# Patient Record
Sex: Female | Born: 1995 | Hispanic: Yes | Marital: Married | Smoking: Never smoker
Health system: Southern US, Community
[De-identification: ages and names within clinical notes are randomized; demographics above are authoritative.]

## PROBLEM LIST (undated history)

## (undated) DIAGNOSIS — Z789 Other specified health status: Secondary | ICD-10-CM

## (undated) DIAGNOSIS — O3680X0 Pregnancy with inconclusive fetal viability, not applicable or unspecified: Secondary | ICD-10-CM

## (undated) HISTORY — PX: ANKLE SURGERY: SHX546

## (undated) HISTORY — DX: Pregnancy with inconclusive fetal viability, not applicable or unspecified: O36.80X0

## (undated) HISTORY — PX: BUNIONECTOMY: SHX129

## (undated) HISTORY — DX: Other specified health status: Z78.9

---
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
## 2017-03-05 ENCOUNTER — Ambulatory Visit: Payer: Medicaid Other | Admitting: Podiatry

## 2017-03-15 ENCOUNTER — Ambulatory Visit: Payer: Self-pay | Admitting: Podiatry

## 2017-03-15 ENCOUNTER — Ambulatory Visit (INDEPENDENT_AMBULATORY_CARE_PROVIDER_SITE_OTHER): Payer: Medicaid Other | Admitting: Podiatry

## 2017-03-15 DIAGNOSIS — B351 Tinea unguium: Secondary | ICD-10-CM

## 2017-03-18 NOTE — Progress Notes (Signed)
   Subjective: Patient presents today for follow-up treatment and evaluation of fungal nails. She states her symptoms are unchanged since previous visit. She reports no change since she has been using Lamisil. Patient presents today for further treatment and evaluation.   No past medical history on file.   Objective: Physical Exam General: The patient is alert and oriented x3 in no acute distress.  Dermatology: Hyperkeratotic, discolored, thickened, onychodystrophy of nails noted.  Skin is warm, dry and supple bilateral lower extremities. Negative for open lesions or macerations.  Vascular: Palpable pedal pulses bilaterally. No edema or erythema noted. Capillary refill within normal limits.  Neurological: Epicritic and protective threshold grossly intact bilaterally.   Musculoskeletal Exam: Range of motion within normal limits to all pedal and ankle joints bilateral. Muscle strength 5/5 in all groups bilateral.   Assessment: #1 onychomycosis right foot  Plan of Care:  #1 Patient was evaluated. #2 Appt with Shanda Bumps, RN for laser treatment. #3 Patient recently completed 90 days of oral Lamisil. #4 Return to clinic in 6 months.  Felecia Shelling, DPM [facility]  Dr. Felecia Shelling, DPM    [address]                                        [place], [place] [address]                Office [phone]  Fax [phone]

## 2017-04-18 ENCOUNTER — Ambulatory Visit: Payer: Medicaid Other

## 2017-11-27 ENCOUNTER — Emergency Department: Payer: Medicaid Other

## 2017-11-27 ENCOUNTER — Other Ambulatory Visit: Payer: Self-pay

## 2017-11-27 ENCOUNTER — Emergency Department
Admission: EM | Admit: 2017-11-27 | Discharge: 2017-11-27 | Disposition: A | Discharge status: Home / Self Care | Payer: Medicaid Other | Attending: Emergency Medicine | Admitting: Emergency Medicine

## 2017-11-27 DIAGNOSIS — Z3490 Encounter for supervision of normal pregnancy, unspecified, unspecified trimester: Secondary | ICD-10-CM

## 2017-11-27 DIAGNOSIS — Z79899 Other long term (current) drug therapy: Secondary | ICD-10-CM | POA: Diagnosis not present

## 2017-11-27 DIAGNOSIS — R1030 Lower abdominal pain, unspecified: Secondary | ICD-10-CM | POA: Diagnosis present

## 2017-11-27 LAB — CHLAMYDIA/NGC RT PCR (ARMC ONLY)
CHLAMYDIA TR: NOT DETECTED
N GONORRHOEAE: NOT DETECTED

## 2017-11-27 LAB — CBC
HEMATOCRIT: 39.8 % (ref 35.0–47.0)
Hemoglobin: 14 g/dL (ref 12.0–16.0)
MCH: 29.2 pg (ref 26.0–34.0)
MCHC: 35.3 g/dL (ref 32.0–36.0)
MCV: 82.8 fL (ref 80.0–100.0)
PLATELETS: 270 10*3/uL (ref 150–440)
RBC: 4.81 MIL/uL (ref 3.80–5.20)
RDW: 13.3 % (ref 11.5–14.5)
WBC: 14.2 10*3/uL — AB (ref 3.6–11.0)

## 2017-11-27 LAB — URINALYSIS, COMPLETE (UACMP) WITH MICROSCOPIC
Bilirubin Urine: NEGATIVE
Glucose, UA: NEGATIVE mg/dL
Hgb urine dipstick: NEGATIVE
Ketones, ur: NEGATIVE mg/dL
Nitrite: NEGATIVE
PH: 7 (ref 5.0–8.0)
Protein, ur: NEGATIVE mg/dL
SPECIFIC GRAVITY, URINE: 1.016 (ref 1.005–1.030)

## 2017-11-27 LAB — COMPREHENSIVE METABOLIC PANEL
ALBUMIN: 4.1 g/dL (ref 3.5–5.0)
ALT: 48 U/L (ref 14–54)
AST: 49 U/L — AB (ref 15–41)
Alkaline Phosphatase: 62 U/L (ref 38–126)
Anion gap: 8 (ref 5–15)
BILIRUBIN TOTAL: 0.8 mg/dL (ref 0.3–1.2)
BUN: 10 mg/dL (ref 6–20)
CHLORIDE: 106 mmol/L (ref 101–111)
CO2: 21 mmol/L — ABNORMAL LOW (ref 22–32)
Calcium: 8.9 mg/dL (ref 8.9–10.3)
Creatinine, Ser: 0.55 mg/dL (ref 0.44–1.00)
GFR calc Af Amer: >60 mL/min (ref >60)
GLUCOSE: 127 mg/dL — AB (ref 65–99)
POTASSIUM: 3.4 mmol/L — AB (ref 3.5–5.1)
Sodium: 135 mmol/L (ref 135–145)
TOTAL PROTEIN: 6.7 g/dL (ref 6.5–8.1)

## 2017-11-27 LAB — WET PREP, GENITAL
CLUE CELLS WET PREP: NONE SEEN
Sperm: NONE SEEN
Trich, Wet Prep: NONE SEEN
Yeast Wet Prep HPF POC: NONE SEEN

## 2017-11-27 LAB — POCT PREGNANCY, URINE: Preg Test, Ur: POSITIVE — AB

## 2017-11-27 LAB — HCG, QUANTITATIVE, PREGNANCY: HCG, BETA CHAIN, QUANT, S: 3159 m[IU]/mL — AB (ref <5)

## 2017-11-27 LAB — LIPASE, BLOOD: Lipase: 45 U/L (ref 11–51)

## 2017-11-27 NOTE — ED Triage Notes (Addendum)
Pt c/o lower abd pain that began last night. Denies N&V&D. Denies pelvic pain. States x 1 week intermittent back and abd pain. Alert, oriented, ambulatory. Still has appendix and gallbladder. Denies urinary symptoms. Hasn't noticed correlation with pain with eating. No distress noted in triage.   States had positive pregnancy test at home. Last period was end of April, has not seen OB yet. Denies vaginal bleeding. States vaginal discharge that is white.

## 2017-11-27 NOTE — Discharge Instructions (Addendum)
You have been seen in the emergency department.  Your results show that you are likely pregnant however no pregnancy can be identified on your ultrasound.  Please follow-up with the health department on Monday for recheck of your pregnancy hormone level (beta hCG) today's level is 3100.  Return to the emergency department for any acute abdominal pain, or any other symptom personally concerning to yourself.

## 2017-11-27 NOTE — ED Provider Notes (Signed)
[facility] Emergency Department Provider Note  Time seen: 6:02 PM  I have reviewed the triage vital signs and the nursing notes.   HISTORY  Chief Complaint Abdominal Pain    HPI Shelby Alvarez is a 22 y.o. female with no significant past medical history presents to the emergency department with lower abdominal discomfort and a positive pregnancy test.  According to the patient for the past 1.5 weeks she has had various abdominal pains including upper abdomen, now lower abdomen.  States they are very mild dull type pains.  States she is also having vaginal discharge.  Took a pregnancy test at home several days ago that was positive.  Patient is not entirely sure when her last period was.  No prior pregnancies miscarriages or abortions.  Currently patient states she feels well with very slight lower abdominal discomfort.  Denies dysuria or hematuria.  Denies any vaginal bleeding.   History reviewed. No pertinent past medical history.  There are no active problems to display for this patient.   Past Surgical History:  Procedure Laterality Date  . ANKLE SURGERY    . BUNIONECTOMY      Prior to Admission medications   Medication Sig Start Date End Date Taking? Authorizing Provider  clindamycin (CLEOCIN T) 1 % lotion Apply topically. 11/06/16   [provider]  ELIDEL 1 % cream apply TO FACE twice a day 11/13/16   [provider]  minocycline (MINOCIN,DYNACIN) 100 MG capsule Take 100 mg by mouth. 11/13/16   [provider]  terbinafine (LAMISIL) 250 MG tablet Take 1 tablet (250 mg total) by mouth daily. 11/27/16   Felecia Shelling, DPM  triamcinolone ointment (KENALOG) 0.1 % apply to affected area twice a day 11/06/16   [provider]    No Known Allergies  History reviewed. No pertinent family history.  Social History Social History   Tobacco Use  . Smoking status: Never Smoker  . Smokeless tobacco: Never Used   Substance Use Topics  . Alcohol use: No    Alcohol/week: 0.0 oz  . Drug use: Not on file    Review of Systems Constitutional: Negative for fever. Eyes: Negative for visual complaints ENT: Negative for recent illness/congestion Cardiovascular: Negative for chest pain. Respiratory: Negative for shortness of breath. Gastrointestinal: Intermittent abdominal pains, currently very mild lower abdominal discomfort.  Negative for nausea vomiting or diarrhea. Genitourinary: Negative for hematuria or dysuria. Musculoskeletal: Negative for musculoskeletal complaints Skin: Negative for skin complaints  Neurological: Negative for headache All other ROS negative  ____________________________________________   PHYSICAL EXAM:  VITAL SIGNS: ED Triage Vitals  Enc Vitals Group     BP 11/27/17 1639 (!) 131/47     Pulse Rate 11/27/17 1639 (!) 121     Resp 11/27/17 1639 18     Temp 11/27/17 1639 98 F (36.7 C)     Temp Source 11/27/17 1639 Oral     SpO2 11/27/17 1639 99 %     Weight 11/27/17 1640 150 lb (68 kg)     Height 11/27/17 1640 4\' 11"  (1.499 m)     Head Circumference --      Peak Flow --      Pain Score 11/27/17 1640 4     Pain Loc --      Pain Edu? --      Excl. in GC? --    Constitutional: Alert and oriented. Well appearing and in no distress. Eyes: Normal exam ENT   Head: Normocephalic  and atraumatic.   Mouth/Throat: Mucous membranes are moist. Cardiovascular: Normal rate, regular rhythm. No murmur Respiratory: Normal respiratory effort without tachypnea nor retractions. Breath sounds are clear  Gastrointestinal: Soft, very minimal suprapubic tenderness to palpation.  No rebound or guarding.  No distention. Musculoskeletal: Nontender with normal range of motion in all extremities.  Neurologic:  Normal speech and language. No gross focal neurologic deficits  Skin:  Skin is warm, dry and intact.  Psychiatric: Mood and affect are normal.    ____________________________________________  RADIOLOGY  No pregnancy identified on ultrasound  ____________________________________________   INITIAL IMPRESSION / ASSESSMENT AND PLAN / ED COURSE  Pertinent labs & imaging results that were available during my care of the patient were reviewed by me and considered in my medical decision making (see chart for details).  Patient presents to the emergency department for mild lower abdominal discomfort and a positive pregnancy test.  Also states white vaginal discharge.  Differential would include pregnancy, ectopic pregnancy, pelvic infection.  We will check labs, perform a pelvic examination, and obtain an ultrasound.  Patient's lab work shows a mild leukocytosis of 14,000 otherwise largely normal.  Pregnancy test is positive.  Beta quantitative test has been sent.  Pelvic exam shows a mild amount of white discharge.  Nontender.  No pregnancy identified on ultrasound.  Patient's beta-hCG is 3100.  I discussed with the patient follow-up on Monday for recheck of her beta hCG level, preferably at the health department.  Patient agreeable to plan of care.  Also discussed return precautions for any acute abdominal pain. ____________________________________________   FINAL CLINICAL IMPRESSION(S) / ED DIAGNOSES  Pregnancy of unknown location    Minna AntisPaduchowski, Camila Maita, MD 11/27/17 2017

## 2017-11-29 LAB — URINE CULTURE

## 2017-12-02 ENCOUNTER — Ambulatory Visit (INDEPENDENT_AMBULATORY_CARE_PROVIDER_SITE_OTHER): Payer: Medicaid Other | Admitting: Obstetrics and Gynecology

## 2017-12-02 ENCOUNTER — Encounter: Payer: Self-pay | Admitting: Obstetrics and Gynecology

## 2017-12-02 VITALS — BP 102/62 | HR 90 | Ht 59.0 in | Wt 159.0 lb

## 2017-12-02 DIAGNOSIS — O283 Abnormal ultrasonic finding on antenatal screening of mother: Secondary | ICD-10-CM

## 2017-12-02 DIAGNOSIS — O3680X0 Pregnancy with inconclusive fetal viability, not applicable or unspecified: Secondary | ICD-10-CM

## 2017-12-02 DIAGNOSIS — N898 Other specified noninflammatory disorders of vagina: Secondary | ICD-10-CM | POA: Diagnosis not present

## 2017-12-02 HISTORY — DX: Pregnancy with inconclusive fetal viability, not applicable or unspecified: O36.80X0

## 2017-12-02 NOTE — Progress Notes (Signed)
Patient ID: Shelby Alvarez, female   DOB: June 02, 1996, 22 y.o.   MRN: [id number]  Reason for Consult: Follow-up (ER f/u pregnancy unknown location)   Referred by No ref. provider found  Subjective:     HPI:  Shelby Alvarez is a 22 y.o. female she presents today for ER follow-up for a pregnancy of unknown location.  She was seen in the ER on 11/27/2017.  She reports that she has been having a small amount of back and neck pain both in the midline.  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  Patient states that the first day of her last menstrual period was on 420 of 2019 she says that her periods are usually irregular.  She said she had regular periods in February March and April but before that had had no period for 4 to 6 months.  She cannot pin time and exact date when she might of conceived.  She denies any history of sexually transmitted infections.  This is her first pregnancy.  She is not a smoker.  She denies any severe abdominal pain.  She denies lightheadedness or dizziness.  She denies any nausea or vomiting.   Records, labs and images from ER visit reviewed.   No past medical history on file. No family history on file. Past Surgical History:  Procedure Laterality Date  . ANKLE SURGERY    . BUNIONECTOMY      Short Social History:  Social History   Tobacco Use  . Smoking status: Never Smoker  . Smokeless tobacco: Never Used  Substance Use Topics  . Alcohol use: No    Alcohol/week: 0.0 oz    No Known Allergies  Current Outpatient Medications  Medication Sig Dispense Refill  . prenatal vitamin w/FE, FA (PRENATAL 1 + 1) 27-1 MG TABS tablet Take 1 tablet by mouth daily at 12 noon.     Current Facility-Administered Medications  Medication Dose Route Frequency Provider Last Rate Last Dose  . triamcinolone acetonide (KENALOG) 10 MG/ML injection 10 mg  10 mg Other Once Asencion Islam, DPM         Review of Systems  Constitutional: Negative for chills, fatigue, fever and unexpected weight change.  HENT: Negative for trouble swallowing.  Eyes: Negative for loss of vision.  Respiratory: Negative for cough, shortness of breath and wheezing.  Cardiovascular: Negative for chest pain, leg swelling, palpitations and syncope.  GI: Negative for abdominal pain, blood in stool, diarrhea, nausea and vomiting.  GU: Negative for difficulty urinating, dysuria, frequency and hematuria.  Musculoskeletal: Negative for back pain, leg pain and joint pain.  Skin: Negative for rash.  Neurological: Negative for dizziness, headaches, light-headedness, numbness and seizures.  Psychiatric: Negative for behavioral problem, confusion, depressed mood and sleep disturbance.        Objective:  Objective   Vitals:   12/02/17 0818  BP: 102/62  Pulse: 90  Weight: 159 lb (72.1 kg)  Height: 4\' 11"  (1.499 m)   Body mass index is 32.11 kg/m.  Physical Exam  Constitutional: She is oriented to person, place, and time. She appears well-developed and well-nourished.  HENT:  Head: Normocephalic and atraumatic.  Eyes: EOM are normal.  Cardiovascular: Normal rate, regular rhythm and normal heart sounds.  Pulmonary/Chest: Effort normal and breath sounds normal.  Abdominal: Soft. She exhibits no distension. There is no tenderness.  Genitourinary:  Genitourinary Comments: Moderate amount of  white vaginal discharge, no vaginal bleeding, small amount of bilateral adnexal and uterine tenderness. No CMT tenderness. No fullness. Uterus anteverted appromimately 9cm in size.   Neurological: She is alert and oriented to person, place, and time.  Skin: Skin is warm and dry.  Psychiatric: She has a normal mood and affect. Her behavior is normal. Judgment and thought content normal.  Nursing note and vitals reviewed.    Assessment/Plan:    22 yo with a pregnancy of unknown location.  Will repeat beta hcg today  and again in 48 hours. No signs of ruptured ectopic pregnancy at this time.   Adelene Idlerhristanna Schuman MD [facility], [facility] 12/02/17 8:23 AM

## 2017-12-03 LAB — BETA HCG QUANT (REF LAB): hCG Quant: 6725 m[IU]/mL

## 2017-12-03 NOTE — Progress Notes (Signed)
Increased, released to mychart.

## 2017-12-04 ENCOUNTER — Other Ambulatory Visit: Payer: Medicaid Other

## 2017-12-04 DIAGNOSIS — O3680X0 Pregnancy with inconclusive fetal viability, not applicable or unspecified: Secondary | ICD-10-CM

## 2017-12-05 LAB — NUSWAB BV AND CANDIDA, NAA
CANDIDA GLABRATA, NAA: NEGATIVE
Candida albicans, NAA: NEGATIVE

## 2017-12-05 LAB — BETA HCG QUANT (REF LAB): HCG QUANT: 9355 m[IU]/mL

## 2017-12-05 NOTE — Progress Notes (Signed)
Increased, no infection.

## 2017-12-11 ENCOUNTER — Ambulatory Visit: Payer: Medicaid Other | Admitting: Obstetrics and Gynecology

## 2017-12-11 ENCOUNTER — Other Ambulatory Visit: Payer: Medicaid Other

## 2017-12-15 ENCOUNTER — Emergency Department: Payer: Medicaid Other

## 2017-12-15 ENCOUNTER — Emergency Department
Admission: EM | Admit: 2017-12-15 | Discharge: 2017-12-15 | Disposition: A | Discharge status: Home / Self Care | Payer: Medicaid Other | Attending: Emergency Medicine | Admitting: Emergency Medicine

## 2017-12-15 ENCOUNTER — Other Ambulatory Visit: Payer: Self-pay

## 2017-12-15 ENCOUNTER — Encounter: Payer: Self-pay | Admitting: Emergency Medicine

## 2017-12-15 DIAGNOSIS — Z79899 Other long term (current) drug therapy: Secondary | ICD-10-CM | POA: Diagnosis not present

## 2017-12-15 DIAGNOSIS — R0789 Other chest pain: Secondary | ICD-10-CM | POA: Diagnosis not present

## 2017-12-15 LAB — CBC
HCT: 43.1 % (ref 35.0–47.0)
Hemoglobin: 15.1 g/dL (ref 12.0–16.0)
MCH: 29.3 pg (ref 26.0–34.0)
MCHC: 35.1 g/dL (ref 32.0–36.0)
MCV: 83.5 fL (ref 80.0–100.0)
PLATELETS: 267 10*3/uL (ref 150–440)
RBC: 5.16 MIL/uL (ref 3.80–5.20)
RDW: 13.9 % (ref 11.5–14.5)
WBC: 14.3 10*3/uL — ABNORMAL HIGH (ref 3.6–11.0)

## 2017-12-15 LAB — BASIC METABOLIC PANEL
Anion gap: 7 (ref 5–15)
BUN: 13 mg/dL (ref 6–20)
CHLORIDE: 108 mmol/L (ref 101–111)
CO2: 21 mmol/L — AB (ref 22–32)
CREATININE: 0.61 mg/dL (ref 0.44–1.00)
Calcium: 9.4 mg/dL (ref 8.9–10.3)
GFR calc non Af Amer: >60 mL/min (ref >60)
GLUCOSE: 98 mg/dL (ref 65–99)
Potassium: 3.9 mmol/L (ref 3.5–5.1)
Sodium: 136 mmol/L (ref 135–145)

## 2017-12-15 LAB — TROPONIN I: Troponin I: <0.03 ng/mL (ref <0.03)

## 2017-12-15 MED ORDER — FAMOTIDINE 20 MG PO TABS: 20.0000 mg | ORAL_TABLET | Freq: Two times a day (BID) | ORAL | 0 refills | Status: DC

## 2017-12-15 MED ORDER — GI COCKTAIL ~~LOC~~
30.0000 mL | Freq: Once | ORAL | Status: AC
  Administered 2017-12-15: 30 mL via ORAL
  Filled 2017-12-15: qty 30

## 2017-12-15 NOTE — ED Provider Notes (Signed)
[facility] Emergency Department Provider Note  ____________________________________________   First MD Initiated Contact with Patient 12/15/17 0518     (approximate)  I have reviewed the triage vital signs and the nursing notes.   HISTORY  Chief Complaint Chest Pain and Shortness of Breath   HPI Shelby Alvarez is a 22 y.o. female who comes to the emergency department with 4 days of constant epigastric upper chest pain and mild shortness of breath.  The pain is worse when bending over and improves when lying flat.  Nonexertional.  Some nausea no vomiting.  She is in her first trimester.  No vaginal discharge.  No bleeding from her vagina.  No abdominal pain.  No history of GERD.  No abdominal surgical history.  No history of DVT or pulmonary embolism.  No recent surgery travel or immobilization.  No hemoptysis.  No leg swelling.  Pain is not ripping or tearing it does not go straight to her back.  It is nonradiating.    Past Medical History:  Diagnosis Date  . No known health problems     Patient Active Problem List   Diagnosis Date Noted  . Pregnancy of unknown anatomic location 12/02/2017    Past Surgical History:  Procedure Laterality Date  . ANKLE SURGERY    . BUNIONECTOMY      Prior to Admission medications   Medication Sig Start Date End Date Taking? Authorizing Provider  prenatal vitamin w/FE, FA (PRENATAL 1 + 1) 27-1 MG TABS tablet Take 1 tablet by mouth daily at 12 noon.   Yes [provider]  famotidine (PEPCID) 20 MG tablet Take 1 tablet (20 mg total) by mouth 2 (two) times daily. 12/15/17 12/15/18  Merrily Brittle, MD    Allergies Patient has no known allergies.  Family History  Problem Relation Age of Onset  . Cancer Neg Hx   . Diabetes Neg Hx   . Hypertension Neg Hx   . Stroke Neg Hx   . Thyroid disease Neg Hx     Social History Social History   Tobacco Use  . Smoking status: Never Smoker  . Smokeless  tobacco: Never Used  Substance Use Topics  . Alcohol use: No    Alcohol/week: 0.0 oz  . Drug use: Never    Review of Systems Constitutional: No fever/chills Eyes: No visual changes. ENT: No sore throat. Cardiovascular: Positive for chest pain. Respiratory: Positive for shortness of breath. Gastrointestinal: No abdominal pain.  Positive for nausea, no vomiting.  No diarrhea.  No constipation. Genitourinary: Negative for dysuria. Musculoskeletal: Negative for back pain. Skin: Negative for rash. Neurological: Negative for headaches, focal weakness or numbness.   ____________________________________________   PHYSICAL EXAM:  VITAL SIGNS: ED Triage Vitals  Enc Vitals Group     BP 12/15/17 0229 96/66     Pulse Rate 12/15/17 0229 92     Resp --      Temp 12/15/17 0229 98.9 F (37.2 C)     Temp Source 12/15/17 0229 Oral     SpO2 12/15/17 0229 99 %     Weight 12/15/17 0226 160 lb (72.6 kg)     Height 12/15/17 0226 4\' 11"  (1.499 m)     Head Circumference --      Peak Flow --      Pain Score 12/15/17 0226 7     Pain Loc --      Pain Edu? --      Excl. in GC? --  Constitutional: Alert and oriented x4 well-appearing nontoxic no diaphoresis speaks full clear sentences Eyes: PERRL EOMI. Head: Atraumatic. Nose: No congestion/rhinnorhea. Mouth/Throat: No trismus Neck: No stridor.   Cardiovascular: Normal rate, regular rhythm. Grossly normal heart sounds.  Good peripheral circulation. Respiratory: Normal respiratory effort.  No retractions. Lungs CTAB and moving good air Gastrointestinal: Soft nontender Musculoskeletal: No lower extremity edema   Neurologic:  Normal speech and language. No gross focal neurologic deficits are appreciated. Skin:  Skin is warm, dry and intact. No rash noted. Psychiatric: Mood and affect are normal. Speech and behavior are normal.    ____________________________________________   DIFFERENTIAL includes but not limited to  Pulmonary  embolism, aortic dissection, pneumothorax, pneumonia, gastric reflux ____________________________________________   LABS (all labs ordered are listed, but only abnormal results are displayed)  Labs Reviewed  BASIC METABOLIC PANEL - Abnormal; Notable for the following components:      Result Value   CO2 21 (*)    All other components within normal limits  CBC - Abnormal; Notable for the following components:   WBC 14.3 (*)    All other components within normal limits  TROPONIN I    Lab work reviewed by me with slightly elevated white count which is nonspecific and likely secondary to pain __________________________________________  EKG  ED ECG REPORT I, Merrily BrittleNeil Robyn Nohr, the attending physician, personally viewed and interpreted this ECG.  Date: 12/16/2017 EKG Time:  Rate: 80 Rhythm: normal sinus rhythm QRS Axis: normal Intervals: normal ST/T Wave abnormalities: normal Narrative Interpretation: no evidence of acute ischemia  ____________________________________________  RADIOLOGY  Chest x-ray reviewed by me with no acute disease ____________________________________________   PROCEDURES  Procedure(s) performed: no  Procedures  Critical Care performed: no  ____________________________________________   INITIAL IMPRESSION / ASSESSMENT AND PLAN / ED COURSE  Pertinent labs & imaging results that were available during my care of the patient were reviewed by me and considered in my medical decision making (see chart for details).   The patient has atypical constant chest pain.  Not pleuritic.  EKG with no rightward axis.  No evidence of DVT.  Do not suspect ACS or pulmonary embolism.  Given a GI cocktail with some improvement in her symptoms.  She very well could have occurred in the setting of pregnancy.  I will prescribe her Pepcid for a month and refer her back to her OB gynecologist.  Strict return precautions given.       ____________________________________________   FINAL CLINICAL IMPRESSION(S) / ED DIAGNOSES  Final diagnoses:  Atypical chest pain      NEW MEDICATIONS STARTED DURING THIS VISIT:  Discharge Medication List as of 12/15/2017  6:21 AM    START taking these medications   Details  famotidine (PEPCID) 20 MG tablet Take 1 tablet (20 mg total) by mouth 2 (two) times daily., Starting Sun 12/15/2017, Until Mon 12/15/2018, Print         Note:  This document was prepared using Dragon voice recognition software and may include unintentional dictation errors.     Merrily Brittleifenbark, Yeimi Debnam, MD 12/16/17 [phone]

## 2017-12-15 NOTE — ED Notes (Signed)
Patient stated she could not breathe when she took the GI cocktail, but cleared her throat and drank some water and recovered.

## 2017-12-15 NOTE — Discharge Instructions (Signed)
It was a pleasure to take care of you today, and thank you for coming to our emergency department.  If you have any questions or concerns before leaving please ask the nurse to grab me and I'm more than happy to go through your aftercare instructions again.  If you were prescribed any opioid pain medication today such as Norco, Vicodin, Percocet, morphine, hydrocodone, or oxycodone please make sure you do not drive when you are taking this medication as it can alter your ability to drive safely.  If you have any concerns once you are home that you are not improving or are in fact getting worse before you can make it to your follow-up appointment, please do not hesitate to call 911 and come back for further evaluation.  Merrily BrittleNeil Tamica Covell, MD  Results for orders placed or performed during the hospital encounter of 12/15/17  Basic metabolic panel  Result Value Ref Range   Sodium 136 135 - 145 mmol/L   Potassium 3.9 3.5 - 5.1 mmol/L   Chloride 108 101 - 111 mmol/L   CO2 21 (L) 22 - 32 mmol/L   Glucose, Bld 98 65 - 99 mg/dL   BUN 13 6 - 20 mg/dL   Creatinine, Ser 7.840.61 0.44 - 1.00 mg/dL   Calcium 9.4 8.9 - 69.610.3 mg/dL   GFR calc non Af Amer >60 >60 mL/min   GFR calc Af Amer >60 >60 mL/min   Anion gap 7 5 - 15  CBC  Result Value Ref Range   WBC 14.3 (H) 3.6 - 11.0 K/uL   RBC 5.16 3.80 - 5.20 MIL/uL   Hemoglobin 15.1 12.0 - 16.0 g/dL   HCT 29.543.1 28.435.0 - 13.247.0 %   MCV 83.5 80.0 - 100.0 fL   MCH 29.3 26.0 - 34.0 pg   MCHC 35.1 32.0 - 36.0 g/dL   RDW 44.013.9 10.211.5 - 72.514.5 %   Platelets 267 150 - 440 K/uL  Troponin I  Result Value Ref Range   Troponin I <0.03 <0.03 ng/mL   Dg Chest 1 View  Result Date: 12/15/2017 CLINICAL DATA:  Initial evaluation for acute shortness of breath. EXAM: CHEST  1 VIEW COMPARISON:  Prior radiograph from 07/02/2005. FINDINGS: The cardiac and mediastinal silhouettes are stable in size and contour, and remain within normal limits. The lungs are normally inflated. No airspace  consolidation, pleural effusion, or pulmonary edema is identified. There is no pneumothorax. No acute osseous abnormality identified.  Trace scoliosis. IMPRESSION: No active cardiopulmonary disease. Electronically Signed   By: Rise MuBenjamin  McClintock M.D.   On: 12/15/2017 05:39   [place] Ob Comp Less 14 Wks  Result Date: 11/27/2017 CLINICAL DATA:  Lower abdominal pain. Estimated gestational age of [redacted] weeks, 2 days by LMP. EXAM: OBSTETRIC <14 WK US AND TRANSVAGINAL OB US TECHNIQUE: Both transabdominal and transvaginal ultrasound examinations were performed for complete evaluation of the gestation as well as the maternal uterus, adnexal regions, and pelvic cul-de-sac. Transvaginal technique was performed to assess early pregnancy. COMPARISON:  Pelvic ultrasound dated Nov 17, 2015. FINDINGS: Intrauterine gestational sac: None Yolk sac:  Not Visualized. Embryo:  Not Visualized. Subchorionic hemorrhage:  None visualized. Maternal uterus/adnexae: Thickened endometrium measuring 3.1 cm. The ovaries are unremarkable. No free fluid. IMPRESSION: 1.  No IUP is visualized. By definition, in the setting of a positive pregnancy test, this reflects a pregnancy of unknown location. Differential considerations include early normal IUP, abnormal IUP/missed abortion, or nonvisualized ectopic pregnancy. Serial beta HCG is suggested. Consider repeat pelvic ultrasound  in 14 days. Electronically Signed   By: Obie Dredge M.D.   On: 11/27/2017 19:28   US Ob Transvaginal  Result Date: 11/27/2017 CLINICAL DATA:  Lower abdominal pain. Estimated gestational age of [redacted] weeks, 2 days by LMP. EXAM: OBSTETRIC <14 WK [place] AND TRANSVAGINAL OB US TECHNIQUE: Both transabdominal and transvaginal ultrasound examinations were performed for complete evaluation of the gestation as well as the maternal uterus, adnexal regions, and pelvic cul-de-sac. Transvaginal technique was performed to assess early pregnancy. COMPARISON:  Pelvic ultrasound dated Nov 17, 2015. FINDINGS: Intrauterine gestational sac: None Yolk sac:  Not Visualized. Embryo:  Not Visualized. Subchorionic hemorrhage:  None visualized. Maternal uterus/adnexae: Thickened endometrium measuring 3.1 cm. The ovaries are unremarkable. No free fluid. IMPRESSION: 1.  No IUP is visualized. By definition, in the setting of a positive pregnancy test, this reflects a pregnancy of unknown location. Differential considerations include early normal IUP, abnormal IUP/missed abortion, or nonvisualized ectopic pregnancy. Serial beta HCG is suggested. Consider repeat pelvic ultrasound in 14 days. Electronically Signed   By: Obie Dredge M.D.   On: 11/27/2017 19:28

## 2017-12-15 NOTE — ED Notes (Signed)
Laughing noted coming out of patient's room

## 2017-12-15 NOTE — ED Triage Notes (Signed)
Pt c/o pain to the center of her chest for 4 days that she says makes it feel like she's having trouble breathing; denies cough or fever; pt says when she bends over the pressure in her chest is worse; also worse with laying down; pt alert and oriented x 3; talking in complete coherent sentences; pt is pregnant, LMP was end of April; pt says she has a US scheduled in 2 weeks; taking prenatal vitamin daily

## 2017-12-15 NOTE — ED Notes (Signed)
XR bedside.

## 2017-12-24 ENCOUNTER — Emergency Department
Admission: EM | Admit: 2017-12-24 | Discharge: 2017-12-24 | Disposition: A | Discharge status: Home / Self Care | Payer: Medicaid Other | Attending: Emergency Medicine | Admitting: Emergency Medicine

## 2017-12-24 ENCOUNTER — Ambulatory Visit: Payer: Medicaid Other | Admitting: Obstetrics and Gynecology

## 2017-12-24 ENCOUNTER — Encounter: Payer: Self-pay | Admitting: Emergency Medicine

## 2017-12-24 ENCOUNTER — Other Ambulatory Visit: Payer: Medicaid Other

## 2017-12-24 DIAGNOSIS — O9989 Other specified diseases and conditions complicating pregnancy, childbirth and the puerperium: Secondary | ICD-10-CM | POA: Diagnosis not present

## 2017-12-24 DIAGNOSIS — Z041 Encounter for examination and observation following transport accident: Secondary | ICD-10-CM | POA: Insufficient documentation

## 2017-12-24 DIAGNOSIS — Z3A10 10 weeks gestation of pregnancy: Secondary | ICD-10-CM | POA: Diagnosis not present
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
## 2018-01-06 ENCOUNTER — Ambulatory Visit (INDEPENDENT_AMBULATORY_CARE_PROVIDER_SITE_OTHER): Payer: Medicaid Other

## 2018-01-06 ENCOUNTER — Other Ambulatory Visit: Payer: Self-pay

## 2018-01-06 ENCOUNTER — Ambulatory Visit (INDEPENDENT_AMBULATORY_CARE_PROVIDER_SITE_OTHER): Payer: Medicaid Other | Admitting: Obstetrics and Gynecology

## 2018-01-06 ENCOUNTER — Encounter: Payer: Self-pay | Admitting: Obstetrics and Gynecology

## 2018-01-06 ENCOUNTER — Other Ambulatory Visit: Payer: Self-pay | Admitting: Obstetrics and Gynecology

## 2018-01-06 VITALS — BP 106/64 | HR 75 | Ht 59.0 in | Wt 156.0 lb

## 2018-01-06 DIAGNOSIS — Z3689 Encounter for other specified antenatal screening: Secondary | ICD-10-CM

## 2018-01-06 DIAGNOSIS — Z3A10 10 weeks gestation of pregnancy: Secondary | ICD-10-CM

## 2018-01-06 DIAGNOSIS — N912 Amenorrhea, unspecified: Secondary | ICD-10-CM

## 2018-01-06 DIAGNOSIS — O3680X0 Pregnancy with inconclusive fetal viability, not applicable or unspecified: Secondary | ICD-10-CM | POA: Diagnosis not present

## 2018-01-06 DIAGNOSIS — R110 Nausea: Secondary | ICD-10-CM

## 2018-01-06 DIAGNOSIS — Z3490 Encounter for supervision of normal pregnancy, unspecified, unspecified trimester: Secondary | ICD-10-CM

## 2018-01-06 NOTE — Progress Notes (Signed)
Patient ID: Shelby Alvarez, female   DOB: 09-10-1995, 22 y.o.   MRN: [id number]  Reason for Consult: Follow-up (OB Ultrasound)   Referred by Adelene IdlerSchuman, Christanna R, *  Subjective:     HPI:  Shelby Alvarez is a 22 y.o. female she was following up today for possible ectopic pregnancy. She was supposed to follow up several weeks ago but today's appointment has been delayed for multiple reasons. She was recently seen in the ER. Ectopic pregnancy has been excluded. She has a normal IUP. Her due date for this pregnancy is 08/03/2018 based on today's 10 week US. She has been taking a PNV. She has nausea and reports vomiting about once a week. She has not been taking any medication for this. She denies vaginal bleeding. Has occasional stretching pelvic pain on left and right side.  She does desire genetic screening for this pregnancy.   Past Medical History:  Diagnosis Date  . No known health problems    Family History  Problem Relation Age of Onset  . Cancer Neg Hx   . Diabetes Neg Hx   . Hypertension Neg Hx   . Stroke Neg Hx   . Thyroid disease Neg Hx    Past Surgical History:  Procedure Laterality Date  . ANKLE SURGERY    . BUNIONECTOMY      Short Social History:  Social History   Tobacco Use  . Smoking status: Never Smoker  . Smokeless tobacco: Never Used  Substance Use Topics  . Alcohol use: No    Alcohol/week: 0.0 oz    No Known Allergies  Current Outpatient Medications  Medication Sig Dispense Refill  . prenatal vitamin w/FE, FA (PRENATAL 1 + 1) 27-1 MG TABS tablet Take 1 tablet by mouth daily at 12 noon.    . famotidine (PEPCID) 20 MG tablet Take 1 tablet (20 mg total) by mouth 2 (two) times daily. (Patient not taking: Reported on 01/06/2018) 60 tablet 0   Current Facility-Administered Medications  Medication Dose Route Frequency Provider Last Rate Last Dose  . triamcinolone acetonide (KENALOG) 10 MG/ML injection 10 mg  10 mg Other Once Asencion IslamStover, Titorya, DPM         Review of Systems  Constitutional: Negative for chills, fatigue, fever and unexpected weight change.  HENT: Negative for trouble swallowing.  Eyes: Negative for loss of vision.  Respiratory: Negative for cough, shortness of breath and wheezing.  Cardiovascular: Negative for chest pain, leg swelling, palpitations and syncope.  GI: Negative for abdominal pain, blood in stool, diarrhea, nausea and vomiting.  GU: Negative for difficulty urinating, dysuria, frequency and hematuria.  Musculoskeletal: Negative for back pain, leg pain and joint pain.  Skin: Negative for rash.  Neurological: Negative for dizziness, headaches, light-headedness, numbness and seizures.  Psychiatric: Negative for behavioral problem, confusion, depressed mood and sleep disturbance.        Objective:  Objective   Vitals:   01/06/18 1122  BP: 106/64  Pulse: 75  Weight: 156 lb (70.8 kg)  Height: 4\' 11"  (1.499 m)   Body mass index is 31.51 kg/m.  Physical Exam  Constitutional: She is oriented to person, place, and time. She appears well-developed and well-nourished.  HENT:  Head: Normocephalic and atraumatic.  Eyes: EOM are normal.  Cardiovascular: Normal rate, regular rhythm and normal heart sounds.  Pulmonary/Chest: Effort normal and breath sounds normal.  Neurological: She is alert and oriented to person, place, and time.  Skin: Skin is warm and dry.  Psychiatric:  She has a normal mood and affect. Her behavior is normal. Judgment and thought content normal.  Nursing note and vitals reviewed.        Assessment/Plan:     22 yo with 10 week pregnancy. 1. Follow up next week for a NOB appointment.  2. Nausea in pregnancy- recommended overt the counter B6 and unisom.  Adelene Idler MD [facility], [facility] 01/06/18 11:41 AM

## 2018-01-13 ENCOUNTER — Encounter: Payer: Self-pay | Admitting: Obstetrics and Gynecology

## 2018-01-13 ENCOUNTER — Ambulatory Visit (INDEPENDENT_AMBULATORY_CARE_PROVIDER_SITE_OTHER): Payer: Medicaid Other | Admitting: Obstetrics and Gynecology

## 2018-01-13 VITALS — BP 112/68 | Wt 162.0 lb

## 2018-01-13 DIAGNOSIS — Z3A11 11 weeks gestation of pregnancy: Secondary | ICD-10-CM | POA: Diagnosis not present

## 2018-01-13 DIAGNOSIS — Z3400 Encounter for supervision of normal first pregnancy, unspecified trimester: Secondary | ICD-10-CM | POA: Insufficient documentation

## 2018-01-13 DIAGNOSIS — Z31438 Encounter for other genetic testing of female for procreative management: Secondary | ICD-10-CM

## 2018-01-13 DIAGNOSIS — Z1379 Encounter for other screening for genetic and chromosomal anomalies: Secondary | ICD-10-CM

## 2018-01-13 DIAGNOSIS — Z3401 Encounter for supervision of normal first pregnancy, first trimester: Secondary | ICD-10-CM | POA: Diagnosis not present

## 2018-01-13 NOTE — Progress Notes (Signed)
New Obstetric Patient H&P    Chief Complaint: "Desires prenatal care"   History of Present Illness: Patient is a 22 y.o. G1P0 Hispanic or Latino female, presents with amenorrhea and positive home pregnancy test. Patient's last menstrual period was 10/12/2017 (approximate). and based on her  LMP, her Estimated Date of Delivery: 08/03/18 and [redacted]w[redacted]d.   Since her LMP she claims she has experienced some nausea, breast tenderness, and fatigue. She denies vaginal bleeding. Her past medical history is noncontributory. Her prior pregnancies are notable for none  Since her LMP, she admits to the use of tobacco products  no There are cats in the home in the home  no She admits close contact with children on a regular basis  no  She has had chicken pox in the past no She has had Tuberculosis exposures, symptoms, or previously tested positive for TB   no Current or past history of domestic violence. no  Genetic Screening/Teratology Counseling: (Includes patient, baby's father, or anyone in either family with:)   1. Patient's age >/= 67 at [facility]  no 2. Thalassemia ([place], [place], Mediterranean, or Asian background): MCV<80  no 3. Neural tube defect (meningomyelocele, spina bifida, anencephaly)  no 4. Congenital heart defect  no  5. Down syndrome  no 6. Tay-Sachs (Jewish, [place]  no 7. Canavan's Disease  no 8. Sickle cell disease or trait (African)  no  9. Hemophilia or other blood disorders  no  10. Muscular dystrophy  no  11. Cystic fibrosis  no  12. Huntington's Chorea  no  13. Mental retardation/autism  no 14. Other inherited genetic or chromosomal disorder  no 15. Maternal metabolic disorder (DM, PKU, etc)  no 16. Patient or FOB with a child with a birth defect not listed above no  16a. Patient or FOB with a birth defect themselves no 17. Recurrent pregnancy loss, or stillbirth  no  18. Any medications since LMP other than prenatal vitamins (include vitamins, supplements, OTC  meds, drugs, alcohol)  no 19. Any other genetic/environmental exposure to discuss  no  Infection History:   1. Lives with someone with TB or TB exposed  no  2. Patient or partner has history of genital herpes  no 3. Rash or viral illness since LMP  no 4. History of STI (GC, CT, HPV, syphilis, HIV)  no 5. History of recent travel :  no  Other pertinent information:  no     Review of Systems:10 point review of systems negative unless otherwise noted in HPI  Past Medical History:  Past Medical History:  Diagnosis Date  . No known health problems   . Pregnancy of unknown anatomic location 12/02/2017    Past Surgical History:  Past Surgical History:  Procedure Laterality Date  . ANKLE SURGERY    . BUNIONECTOMY      Gynecologic History: Patient's last menstrual period was 10/12/2017 (approximate).  Obstetric History: G1P0  Family History:  Family History  Problem Relation Age of Onset  . Cancer Neg Hx   . Diabetes Neg Hx   . Hypertension Neg Hx   . Stroke Neg Hx   . Thyroid disease Neg Hx     Social History:  Social History   Socioeconomic History  . Marital status: Single    Spouse name: Not on file  . Number of children: Not on file  . Years of education: Not on file  . Highest education level: Not on file  Occupational History  . Not on file  Social Needs  . Financial resource strain: Not on file  . Food insecurity:    Worry: Not on file    Inability: Not on file  . Transportation needs:    Medical: Not on file    Non-medical: Not on file  Tobacco Use  . Smoking status: Never Smoker  . Smokeless tobacco: Never Used  Substance and Sexual Activity  . Alcohol use: No    Alcohol/week: 0.0 oz  . Drug use: Never  . Sexual activity: Yes    Birth control/protection: None  Lifestyle  . Physical activity:    Days per week: 0 days    Minutes per session: 0 min  . Stress: Not on file  Relationships  . Social connections:    Talks on phone: Not on file      Gets together: Not on file    Attends religious service: Not on file    Active member of club or organization: Not on file    Attends meetings of clubs or organizations: Not on file    Relationship status: Not on file  . Intimate partner violence:    Fear of current or ex partner: Not on file    Emotionally abused: Not on file    Physically abused: Not on file    Forced sexual activity: Not on file  Other Topics Concern  . Not on file  Social History Narrative  . Not on file    Allergies:  No Known Allergies  Medications: Prior to Admission medications   Medication Sig Start Date End Date Taking? Authorizing Provider  prenatal vitamin w/FE, FA (PRENATAL 1 + 1) 27-1 MG TABS tablet Take 1 tablet by mouth daily at 12 noon.   Yes [provider]  famotidine (PEPCID) 20 MG tablet Take 1 tablet (20 mg total) by mouth 2 (two) times daily. Patient not taking: Reported on 01/06/2018 12/15/17 12/15/18  Merrily Brittle, MD    Physical Exam Vitals: Blood pressure 112/68, weight 162 lb (73.5 kg), last menstrual period 10/12/2017.  General: NAD HEENT: normocephalic, anicteric Pulmonary: No increased work of breathing, CTAB Abdomen: Soft, non-tender, non-distended.  Umbilicus without lesions.  No hepatomegaly, splenomegaly or masses palpable. No evidence of hernia  FHT 169 Extremities: no edema, erythema, or tenderness Neurologic: Grossly intact Psychiatric: mood appropriate, affect full   Assessment: 22 y.o. G1P0 at [redacted]w[redacted]d presenting to initiate prenatal care  Plan: Problem List Items Addressed This Visit      Other   Supervision of normal first pregnancy, antepartum   Relevant Orders   Urine Culture   RPR+Rh+ABO+Rub Ab+Ab Scr+CB...   Hemoglobinopathy evaluation   Inheritest Core(CF97,SMA,FraX)   MaterniT 21 plus Core, Blood    Other Visit Diagnoses    [redacted] weeks gestation of pregnancy    -  Primary   Relevant Orders   Urine Culture   RPR+Rh+ABO+Rub Ab+Ab Scr+CB...    Hemoglobinopathy evaluation   Inheritest Core(CF97,SMA,FraX)   MaterniT 21 plus Core, Blood   Encounter for genetic screening for Down Syndrome       Relevant Orders   MaterniT 21 plus Core, Blood   Encounter for other genetic testing of female for procreative management       Relevant Orders   Inheritest Core(CF97,SMA,FraX)      1) Avoid alcoholic beverages. 2) Patient encouraged not to smoke.  3) Discontinue the use of all non-medicinal drugs and chemicals.  4) Take prenatal vitamins daily.  5) Nutrition, food safety (fish, cheese advisories, and high nitrite  foods) and exercise discussed. 6) Hospital and practice style discussed with cross coverage system.  7) Genetic Screening, such as with 1st Trimester Screening, cell free fetal DNA, AFP testing, and Ultrasound, as well as with amniocentesis and CVS as appropriate, is discussed with patient. At the conclusion of today's visit patient requested genetic testing 8) Patient is asked about travel to areas at risk for the Zika virus, and counseled to avoid travel and exposure to mosquitoes or sexual partners who may have themselves been exposed to the virus. Testing is discussed, and will be ordered as appropriate.   Vena AustriaAndreas Ashur Glatfelter, MD, Merlinda FrederickFACOG [facility], [facility] 01/13/2018, 10:47 AM

## 2018-01-13 NOTE — Progress Notes (Signed)
NOB 

## 2018-01-15 LAB — RPR+RH+ABO+RUB AB+AB SCR+CB...
ANTIBODY SCREEN: NEGATIVE
HEMATOCRIT: 42.4 % (ref 34.0–46.6)
HIV SCREEN 4TH GENERATION: NONREACTIVE
Hemoglobin: 14.4 g/dL (ref 11.1–15.9)
Hepatitis B Surface Ag: NEGATIVE
MCH: 28.5 pg (ref 26.6–33.0)
MCHC: 34 g/dL (ref 31.5–35.7)
MCV: 84 fL (ref 79–97)
PLATELETS: 250 10*3/uL (ref 150–450)
RBC: 5.05 x10E6/uL (ref 3.77–5.28)
RDW: 14.3 % (ref 12.3–15.4)
RH TYPE: POSITIVE
RPR Ser Ql: NONREACTIVE
RUBELLA: 6.16 {index} (ref >0.99)
Varicella zoster IgG: 839 index (ref >165)
WBC: 11.5 10*3/uL — AB (ref 3.4–10.8)

## 2018-01-15 LAB — URINE CULTURE: Organism ID, Bacteria: NO GROWTH

## 2018-01-15 LAB — HEMOGLOBINOPATHY EVALUATION
HGB C: 0 %
HGB S: 0 %
HGB VARIANT: 0 %
Hemoglobin A2 Quantitation: 2.8 % (ref 1.8–3.2)
Hemoglobin F Quantitation: 0 % (ref 0.0–2.0)
Hgb A: 97.2 % (ref 96.4–98.8)

## 2018-01-18 LAB — MATERNIT 21 PLUS CORE, BLOOD
CHROMOSOME 18: NEGATIVE
Chromosome 13: NEGATIVE
Chromosome 21: NEGATIVE
Y CHROMOSOME: NOT DETECTED

## 2018-01-21 ENCOUNTER — Encounter: Payer: Self-pay | Admitting: Obstetrics and Gynecology

## 2018-01-24 LAB — INHERITEST CORE(CF97,SMA,FRAX)

## 2018-01-25 ENCOUNTER — Emergency Department: Payer: Medicaid Other

## 2018-01-25 ENCOUNTER — Other Ambulatory Visit: Payer: Self-pay

## 2018-01-25 ENCOUNTER — Emergency Department
Admission: EM | Admit: 2018-01-25 | Discharge: 2018-01-25 | Disposition: A | Discharge status: Home / Self Care | Payer: Medicaid Other | Attending: Emergency Medicine | Admitting: Emergency Medicine

## 2018-01-25 DIAGNOSIS — O418X20 Other specified disorders of amniotic fluid and membranes, second trimester, not applicable or unspecified: Secondary | ICD-10-CM

## 2018-01-25 DIAGNOSIS — O468X2 Other antepartum hemorrhage, second trimester: Secondary | ICD-10-CM

## 2018-01-25 DIAGNOSIS — O209 Hemorrhage in early pregnancy, unspecified: Secondary | ICD-10-CM | POA: Diagnosis present

## 2018-01-25 DIAGNOSIS — O468X1 Other antepartum hemorrhage, first trimester: Secondary | ICD-10-CM | POA: Insufficient documentation

## 2018-01-25 DIAGNOSIS — Z79899 Other long term (current) drug therapy: Secondary | ICD-10-CM | POA: Diagnosis not present

## 2018-01-25 DIAGNOSIS — Z3A13 13 weeks gestation of pregnancy: Secondary | ICD-10-CM | POA: Diagnosis not present

## 2018-01-25 DIAGNOSIS — O418X11 Other specified disorders of amniotic fluid and membranes, first trimester, fetus 1: Secondary | ICD-10-CM | POA: Diagnosis not present

## 2018-01-25 LAB — URINALYSIS, COMPLETE (UACMP) WITH MICROSCOPIC
BILIRUBIN URINE: NEGATIVE
GLUCOSE, UA: NEGATIVE mg/dL
KETONES UR: 5 mg/dL — AB
NITRITE: NEGATIVE
PH: 5 (ref 5.0–8.0)
Protein, ur: 30 mg/dL — AB
SPECIFIC GRAVITY, URINE: 1.03 (ref 1.005–1.030)

## 2018-01-25 LAB — CBC WITH DIFFERENTIAL/PLATELET
Basophils Absolute: 0.1 10*3/uL (ref 0–0.1)
Basophils Relative: 1 %
EOS ABS: 0.1 10*3/uL (ref 0–0.7)
EOS PCT: 1 %
HCT: 41.7 % (ref 35.0–47.0)
HEMOGLOBIN: 14.8 g/dL (ref 12.0–16.0)
Lymphocytes Relative: 20 %
Lymphs Abs: 2.2 10*3/uL (ref 1.0–3.6)
MCH: 29.8 pg (ref 26.0–34.0)
MCHC: 35.5 g/dL (ref 32.0–36.0)
MCV: 83.9 fL (ref 80.0–100.0)
MONO ABS: 0.8 10*3/uL (ref 0.2–0.9)
MONOS PCT: 8 %
Neutro Abs: 7.6 10*3/uL — ABNORMAL HIGH (ref 1.4–6.5)
Neutrophils Relative %: 70 %
Platelets: 263 10*3/uL (ref 150–440)
RBC: 4.97 MIL/uL (ref 3.80–5.20)
RDW: 14 % (ref 11.5–14.5)
WBC: 10.9 10*3/uL (ref 3.6–11.0)

## 2018-01-25 LAB — HCG, QUANTITATIVE, PREGNANCY: hCG, Beta Chain, Quant, S: 216830 m[IU]/mL — ABNORMAL HIGH (ref <5)

## 2018-01-25 NOTE — ED Notes (Signed)
Pt instructed to follow up with OB/GYN, pt had further questions about her vaginal bleeding, Dr. Fanny BienQuale able to discuss with patient follow up and return precautions.

## 2018-01-25 NOTE — Discharge Instructions (Addendum)
Please follow up closely with obstetrics and gynecology within 3-4 days (Call Monday to schedule).  Return to the emergency room if your bleeding worsens, you become weak and dizzy or lightheaded, you have an episode of passing out, develop severe bleeding such as more than 1 soaked pad per hour for more than 3 straight hours, develop abdominal or pelvic pain, fevers chills or other new concerns arise.

## 2018-01-25 NOTE — ED Notes (Signed)
Pt up to bathroom, concerned about bleeding noted in the toilet, pt pointing to a blood clot in the toilet and having all family members to come in to the bathroom to view, pt and family reassured that it was a blood clot, pt given disposable underwear and pad for comfort

## 2018-01-25 NOTE — ED Triage Notes (Signed)
Pt to ed with c/o vaginal bleeding that started this am, brown and red in color.  Pt states moderated amount.  Denies abd pain associated with the bleeding.  G1P0

## 2018-01-25 NOTE — ED Provider Notes (Signed)
[facility] Emergency Department Provider Note   ____________________________________________   First MD Initiated Contact with Patient 01/25/18 [phone]     (approximate)  I have reviewed the triage vital signs and the nursing notes.   HISTORY  Chief Complaint Vaginal Bleeding    HPI Shelby Alvarez is a 22 y.o. female reports she is about 12-1/[redacted] weeks pregnant  Patient got up this morning noticed that she had some slight amount of bleeding in the toilet when she urinated, she realized that seem to be coming from the vagina.  She reports she is pregnant and is not causing any pain, and the bleeding seems to have tapered away now but she did pass a small clot  No pain.  No fevers or chills.  No abdominal pain.  No discharge except for a small amount of blood that seems to be going away now.  Follows with [facility].  Denies any complications with this pregnancy.  Past Medical History:  Diagnosis Date  . No known health problems   . Pregnancy of unknown anatomic location 12/02/2017    Patient Active Problem List   Diagnosis Date Noted  . Supervision of normal first pregnancy, antepartum 01/13/2018    Past Surgical History:  Procedure Laterality Date  . ANKLE SURGERY    . BUNIONECTOMY      Prior to Admission medications   Medication Sig Start Date End Date Taking? Authorizing Provider  ketoconazole (NIZORAL) 2 % shampoo Apply 1 application topically 2 (two) times a week. Wet trunk, apply to rash and let it sit for 5 minutes, then rinse. Repeat daily for 1 week, then weekly. 12/27/17  Yes [provider]  prenatal vitamin w/FE, FA (PRENATAL 1 + 1) 27-1 MG TABS tablet Take 1 tablet by mouth daily at 12 noon.   Yes [provider]  famotidine (PEPCID) 20 MG tablet Take 1 tablet (20 mg total) by mouth 2 (two) times daily. Patient not taking: Reported on 01/06/2018 12/15/17 12/15/18  Merrily Brittle, MD    Allergies Patient has no  known allergies.  Family History  Problem Relation Age of Onset  . Cancer Neg Hx   . Diabetes Neg Hx   . Hypertension Neg Hx   . Stroke Neg Hx   . Thyroid disease Neg Hx     Social History Social History   Tobacco Use  . Smoking status: Never Smoker  . Smokeless tobacco: Never Used  Substance Use Topics  . Alcohol use: No    Alcohol/week: 0.0 oz  . Drug use: Never    Review of Systems Constitutional: No fever/chills Eyes: No visual changes. ENT: No sore throat. Cardiovascular: Denies chest pain. Respiratory: Denies shortness of breath. Gastrointestinal: No abdominal pain.  No nausea, no vomiting.  No diarrhea.  No constipation. Genitourinary: Negative for dysuria.  No foul odor.  See HPI regarding symptoms Musculoskeletal: Negative for back pain. Skin: Negative for rash. Neurological: Negative for headaches.    ____________________________________________   PHYSICAL EXAM:  VITAL SIGNS: ED Triage Vitals  Enc Vitals Group     BP 01/25/18 0748 118/76     Pulse Rate 01/25/18 0748 94     Resp 01/25/18 0748 18     Temp 01/25/18 0748 97.8 F (36.6 C)     Temp Source 01/25/18 0748 Oral     SpO2 01/25/18 0748 96 %     Weight 01/25/18 0745 160 lb (72.6 kg)     Height 01/25/18 0745 4\' 11"  (  1.499 m)     Head Circumference --      Peak Flow --      Pain Score 01/25/18 0745 0     Pain Loc --      Pain Edu? --      Excl. in GC? --     Constitutional: Alert and oriented. Well appearing and in no acute distress. Eyes: Conjunctivae are normal. Head: Atraumatic. Nose: No congestion/rhinnorhea. Mouth/Throat: Mucous membranes are moist. Neck: No stridor.   Cardiovascular: Normal rate, regular rhythm. Grossly normal heart sounds.  Good peripheral circulation. Respiratory: Normal respiratory effort.  No retractions. Lungs CTAB. Gastrointestinal: Soft and nontender. No distention.  Uuterine fundus is her felt just about 2 to 3 cm above the pelvic brim.  There is no  tenderness suprapubically or throughout the abdomen. Musculoskeletal: No lower extremity tenderness nor edema.  No CVA tenderness bilateral.  Neurologic:  Normal speech and language. No gross focal neurologic deficits are appreciated.  Skin:  Skin is warm, dry and intact. No rash noted. Psychiatric: Mood and affect are normal. Speech and behavior are normal.  ____________________________________________   LABS (all labs ordered are listed, but only abnormal results are displayed)  Labs Reviewed  HCG, QUANTITATIVE, PREGNANCY - Abnormal; Notable for the following components:      Result Value   hCG, Beta Chain, Quant, S 216,830 (*)    All other components within normal limits  URINALYSIS, COMPLETE (UACMP) WITH MICROSCOPIC - Abnormal; Notable for the following components:   Color, Urine YELLOW (*)    APPearance CLOUDY (*)    Hgb urine dipstick LARGE (*)    Ketones, ur 5 (*)    Protein, ur 30 (*)    Leukocytes, UA TRACE (*)    Bacteria, UA RARE (*)    All other components within normal limits  CBC WITH DIFFERENTIAL/PLATELET - Abnormal; Notable for the following components:   Neutro Abs 7.6 (*)    All other components within normal limits  CHLAMYDIA/NGC RT PCR ([facility] ONLY)  WET PREP, GENITAL  URINE CULTURE   ____________________________________________  EKG   ____________________________________________  RADIOLOGY  US Ob Comp < 14 Wks  Result Date: 01/25/2018 CLINICAL DATA:  Vaginal bleeding. EXAM: OBSTETRIC <14 WK ULTRASOUND TECHNIQUE: Transabdominal ultrasound was performed for evaluation of the gestation as well as the maternal uterus and adnexal regions. COMPARISON:  None. FINDINGS: Intrauterine gestational sac: Single Yolk sac:  Not Visualized. Embryo:  Visualized. Cardiac Activity: Visualized. Heart Rate: 182 bpm MSD:   mm    w     d CRL:   68 mm   13 w 1 d                  [place] EDC: 08/01/2018 Subchorionic hemorrhage: Echogenic masslike structure located inferior to  the gestational sac, measuring 4 x 1.3 x 2.9 cm, presumed subchorionic hemorrhage. Maternal uterus/adnexae: RIGHT ovary is not seen but there is no mass or free fluid in the RIGHT adnexal region. Maternal LEFT ovary appears normal. No mass or free fluid in the LEFT adnexal region. IMPRESSION: 1. Presumed acute subchorionic hemorrhage located inferior to the gestational sac, moderate to large in size, measuring 4 x 1.3 x 2.9 cm. 2. Single live intrauterine pregnancy with estimated gestational age of [redacted] weeks and 1 day. Fetal heart rate measured at 182 beats per minute. 3. No adnexal mass or free fluid. Electronically Signed   By: Bary Richard M.D.   On: 01/25/2018 10:35    Ultrasound  results reviewed. ____________________________________________   PROCEDURES  Procedure(s) performed: None  Procedures  Critical Care performed: No  ____________________________________________   INITIAL IMPRESSION / ASSESSMENT AND PLAN / ED COURSE  Pertinent labs & imaging results that were available during my care of the patient were reviewed by me and considered in my medical decision making (see chart for details).  Patient presents for evaluation of painless vaginal bleeding.  Will initiate evaluation with ultrasound, she is pregnant with positive hCG and her bleeding seems to have tapered off.  Hemodynamics and blood work reassuring.  Previous evaluation denotes her to be Rh+.  Clinical Course as of Jan 26 1124  Sat Jan 25, 2018  1058 Discussed with Dr. Bonney AidStaebler who reviewed labs and ultrasound findings/clinical history. Advises no acute treatment but follow-up in about 3-4 days with [facility].    [MQ]    Clinical Course User Index [MQ] Sharyn CreamerQuale, Ilaria Much, MD   ----------------------------------------- 11:28 AM on 01/25/2018 -----------------------------------------  Results reviewed with patient, [facility]  Return precautions discussed and given.  Patient  comfortable with plan.  ____________________________________________   FINAL CLINICAL IMPRESSION(S) / ED DIAGNOSES  Final diagnoses:  Subchorionic hemorrhage of placenta in second trimester, single or unspecified fetus      NEW MEDICATIONS STARTED DURING THIS VISIT:  New Prescriptions   No medications on file     Note:  This document was prepared using Dragon voice recognition software and may include unintentional dictation errors.     Sharyn CreamerQuale, Tremond Shimabukuro, MD 01/25/18 1128

## 2018-01-25 NOTE — ED Notes (Signed)
Pt taken to u/s by u/s tech at this time

## 2018-01-25 NOTE — ED Provider Notes (Signed)
Patient reported when she got up to be discharged she had some additional bleeding in her tampon.  She did have one fairly soaked tampon after she been laying in the bed for some time.  I discussed with her and we discussed observation in the ER versus careful bleeding precautions and return precautions.  Patient reports she is comfortable with the plan to go home, will return if a consistent bleeding, soaking through large amounts of tampon including more than a tampon every hour.  She will return to the emergency room if any concerns heavy bleeding, lightheadedness or pain.  She does have a plan in place and understands to follow-up with OB this week with [facility] as well.  She is alert, stable hemodynamics.  No evidence of large hemorrhages are noted, though she did soak one tampon.  Patient would like to be discharged, I think this is agreeable.  Careful return precautions discussed.  Normal vital signs.  Normal and fully alert.  No symptoms of any lightheadedness abdominal pain weakness.   Sharyn CreamerQuale, Mark, MD 01/25/18 [phone]

## 2018-01-26 LAB — URINE CULTURE
Culture: NO GROWTH
Special Requests: NORMAL

## 2018-01-29 ENCOUNTER — Ambulatory Visit (INDEPENDENT_AMBULATORY_CARE_PROVIDER_SITE_OTHER): Payer: Medicaid Other | Admitting: Obstetrics and Gynecology

## 2018-01-29 VITALS — BP 100/84 | Wt 159.0 lb

## 2018-01-29 DIAGNOSIS — O209 Hemorrhage in early pregnancy, unspecified: Secondary | ICD-10-CM

## 2018-01-29 DIAGNOSIS — Z3A13 13 weeks gestation of pregnancy: Secondary | ICD-10-CM

## 2018-01-29 NOTE — Progress Notes (Signed)
ROB ER follow up bleeding 

## 2018-01-29 NOTE — Progress Notes (Signed)
Obstetric Problem Visit    Chief Complaint:  Chief Complaint  Patient presents with  . ROB    ER follow up/subchorionic hemmorhage    History of Present Illness: Patient is a 22 y.o. G1P0 7343w4d presenting for first trimester bleeding.  The onset of bleeding was 01/25/2018.  Is bleeding equal to or greater than normal menstrual flow:  Yes Any recent trauma:  No Recent intercourse:  No History of prior miscarriage:  No Prior ultrasound demonstrating IUP: [facility] and [facility] showing subchorionic hemorrhage and viable IUP.  Prior ultrasound demonstrating viable IUP:  Yes Prior Serum HCG:  Yes 216,830 mIU/mL on 01/25/2018 Rh status: B positive  No further bleeding after initial 24-hrs of bleeding.  Review of Systems: ROS  Past Medical History:  Past Medical History:  Diagnosis Date  . No known health problems   . Pregnancy of unknown anatomic location 12/02/2017    Past Surgical History:  Past Surgical History:  Procedure Laterality Date  . ANKLE SURGERY    . BUNIONECTOMY      Obstetric History: G1P0  Family History:  Family History  Problem Relation Age of Onset  . Cancer Neg Hx   . Diabetes Neg Hx   . Hypertension Neg Hx   . Stroke Neg Hx   . Thyroid disease Neg Hx     Social History:  Social History   Socioeconomic History  . Marital status: Married    Spouse name: Not on file  . Number of children: Not on file  . Years of education: Not on file  . Highest education level: Not on file  Occupational History  . Not on file  Social Needs  . Financial resource strain: Not on file  . Food insecurity:    Worry: Not on file    Inability: Not on file  . Transportation needs:    Medical: Not on file    Non-medical: Not on file  Tobacco Use  . Smoking status: Never Smoker  . Smokeless tobacco: Never Used  Substance and Sexual Activity  . Alcohol use: No    Alcohol/week: 0.0 standard drinks  . Drug use: Never  . Sexual activity: Yes   Birth control/protection: None  Lifestyle  . Physical activity:    Days per week: 0 days    Minutes per session: 0 min  . Stress: Not on file  Relationships  . Social connections:    Talks on phone: Not on file    Gets together: Not on file    Attends religious service: Not on file    Active member of club or organization: Not on file    Attends meetings of clubs or organizations: Not on file    Relationship status: Not on file  . Intimate partner violence:    Fear of current or ex partner: Not on file    Emotionally abused: Not on file    Physically abused: Not on file    Forced sexual activity: Not on file  Other Topics Concern  . Not on file  Social History Narrative  . Not on file    Allergies:  No Known Allergies  Medications: Prior to Admission medications   Medication Sig Start Date End Date Taking? Authorizing Provider  ketoconazole (NIZORAL) 2 % shampoo Apply 1 application topically 2 (two) times a week. Wet trunk, apply to rash and let it sit for 5 minutes, then rinse. Repeat daily for 1 week, then weekly. 12/27/17  Yes [provider]  prenatal vitamin w/FE, FA (PRENATAL 1 + 1) 27-1 MG TABS tablet Take 1 tablet by mouth daily at 12 noon.   Yes [provider]  famotidine (PEPCID) 20 MG tablet Take 1 tablet (20 mg total) by mouth 2 (two) times daily. Patient not taking: Reported on 01/06/2018 12/15/17 12/15/18  Merrily Brittle, MD    Physical Exam Vitals: Blood pressure 100/84, weight 159 lb (72.1 kg), last menstrual period 10/12/2017. General: NAD HEENT: normocephalic, anicteric Neck: Thyroid non-enlarged, no nodules Pulmonary: No increased work of breathing, Abdomen: NABS, soft, non-tender, non-distended.  Umbilicus without lesions.  No hepatomegaly, splenomegaly or masses palpable. No evidence of hernia. FHT 150 Extremities: no edema, erythema, or tenderness Neurologic: Grossly intact Psychiatric: mood appropriate, affect full  Assessment: 22  y.o. G1P0 [redacted]w[redacted]d presenting for evaluation of first trimester vaginal bleeding  Plan: Problem List Items Addressed This Visit    None    Visit Diagnoses    [redacted] weeks gestation of pregnancy    -  Primary   First trimester bleeding          1) First trimester bleeding - incidence and clinical course of first trimester bleeding is discussed in detail with the patient today.  Approximately 1/3 of pregnancies ending in live births experienced 1st trimester bleeding.  The amount of bleeding is variable and not necessarily predictive of outcome.  Sources may be cervical or uterine.  Subchorionic hemorrhages are a frequent concurrent findings on ultrasound and are followed expectantly.  These often absorb or regress spontaneously although risk for expansion and further disruption of the utero-placental interface leading to miscarriage is possible.  There is no clearly documented benefit to limiting or modifying activity and sexual intercourse in altering clinic course of 1st trimester bleeding.    2) If no already done will proceed with TVUS evaluation to document viability, and if uncertain viability or absence of a demonstrable IUP (and no previous documentation of IUP) will trend HCG levels.  3) The patient is Rh positive, rhogam is therefore not  indicated to decrease the risk rhesus alloimmunization.    4) Routine bleeding precautions were discussed with the patient prior the conclusion of today's visit.  5) Return in about 1 week (around 02/05/2018) for ROB.     Vena Austria, MD, [facility], [facility] 01/30/2018, 6:40 PM

## 2018-02-05 ENCOUNTER — Ambulatory Visit (INDEPENDENT_AMBULATORY_CARE_PROVIDER_SITE_OTHER): Payer: Medicaid Other | Admitting: Obstetrics and Gynecology

## 2018-02-05 VITALS — BP 112/72 | Wt 159.0 lb

## 2018-02-05 DIAGNOSIS — Z3400 Encounter for supervision of normal first pregnancy, unspecified trimester: Secondary | ICD-10-CM

## 2018-02-05 DIAGNOSIS — Z3A14 14 weeks gestation of pregnancy: Secondary | ICD-10-CM

## 2018-02-05 DIAGNOSIS — O4692 Antepartum hemorrhage, unspecified, second trimester: Secondary | ICD-10-CM

## 2018-02-05 LAB — POCT URINALYSIS DIPSTICK OB
Glucose, UA: NEGATIVE — AB
PROTEIN: NEGATIVE

## 2018-02-05 NOTE — Addendum Note (Signed)
Addended by: Swazil[place]ORDAN, Zonya Gudger B on: 02/05/2018 03:49 PM   Modules accepted: Orders

## 2018-02-05 NOTE — Progress Notes (Signed)
    Routine Prenatal Care Visit  Subjective  Shelby Alvarez is a 22 y.o. G1P0 at 7371w3d being seen today for ongoing prenatal care.  She is currently monitored for the following issues for this low-risk pregnancy and has Supervision of normal first pregnancy, antepartum on their problem list.  ----------------------------------------------------------------------------------- Patient reports no complaints.  Still having intermittent brown spotting  Contractions: Not present. Vag. Bleeding: Scant.  Movement: Absent. Denies leaking of fluid.  ----------------------------------------------------------------------------------- The following portions of the patient's history were reviewed and updated as appropriate: allergies, current medications, past family history, past medical history, past social history, past surgical history and problem list. Problem list updated.   Objective  Blood pressure 112/72, weight 159 lb (72.1 kg), last menstrual period 10/12/2017. Pregravid weight 155 lb (70.3 kg) Total Weight Gain 4 lb (1.814 kg) Urinalysis:      Fetal Status: Fetal Heart Rate (bpm): 165   Movement: Absent     General:  Alert, oriented and cooperative. Patient is in no acute distress.  Skin: Skin is warm and dry. No rash noted.   Cardiovascular: Normal heart rate noted  Respiratory: Normal respiratory effort, no problems with respiration noted  Abdomen: Soft, gravid, appropriate for gestational age. Pain/Pressure: Absent     Pelvic:  Cervical exam deferred        Extremities: Normal range of motion.     ental Status: Normal mood and affect. Normal behavior. Normal judgment and thought content.     Assessment   22 y.o. G1P0 at 1671w3d by  08/03/2018, by Ultrasound presenting for routine prenatal visit  Plan   Pregnancy#1 Problems (from 10/12/17 to present)    Problem Noted Resolved   Supervision of normal first pregnancy, antepartum 01/13/2018 by Vena AustriaStaebler, Charlot Gouin, MD No   Overview  Addendum 02/03/2018  8:25 AM by Vena AustriaStaebler, Rubens Cranston, MD    Clinic [facility] Prenatal Labs  Dating 10 week US Blood type: B positive  Genetic Screen NIPS: negative XX SMA: neg FragileX: neg CF: neg Antibody:negative  Anatomic US  Rubella: Immune Varicella: Immune  GTT  RPR: NR  Rhogam N/A HBsAg: negative  TDaP vaccine                       Flu Shot: HIV: negative  Baby Food                                GBS:   Contraception  GBS prior pregnancy: N/A  Pelvis Tested  N/A Pap: N/A <20  CS/VBAC  HgbAA  Support Person               Gestational age appropriate obstetric precautions including but not limited to vaginal bleeding, contractions, leaking of fluid and fetal movement were reviewed in detail with the patient.  - keep closer follow up until bleeding resolved   Return in about 1 week (around 02/12/2018) for ROB.  Vena AustriaAndreas Azlyn Wingler, MD, Merlinda FrederickFACOG [facility], [facility] 02/05/2018, 3:40 PM

## 2018-02-05 NOTE — Progress Notes (Signed)
ROB

## 2018-02-10 ENCOUNTER — Ambulatory Visit (INDEPENDENT_AMBULATORY_CARE_PROVIDER_SITE_OTHER): Payer: Medicaid Other | Admitting: Certified Nurse Midwife

## 2018-02-10 ENCOUNTER — Other Ambulatory Visit (INDEPENDENT_AMBULATORY_CARE_PROVIDER_SITE_OTHER): Payer: Medicaid Other

## 2018-02-10 VITALS — BP 100/60 | Wt 156.2 lb

## 2018-02-10 DIAGNOSIS — Z3A15 15 weeks gestation of pregnancy: Secondary | ICD-10-CM

## 2018-02-10 DIAGNOSIS — O4692 Antepartum hemorrhage, unspecified, second trimester: Secondary | ICD-10-CM

## 2018-02-10 LAB — POCT URINALYSIS DIPSTICK OB
Glucose, UA: NEGATIVE — AB
PROTEIN: NEGATIVE

## 2018-02-10 NOTE — Progress Notes (Signed)
ROB at 15wk 1 day. Continues to have brown bleeding/ wearing miniliners. Thinks that she has about 1 TBSP/day of bleeding. No cramping. On Aug 3 ultrsaound the subchorionic hemorrhage was measuring 4 x 1.3 x 2.9 cm. Today it measures a little smaller: 2.55 x 1.09 x 1.94 cm. CGA of baby 14wk 6days and FCA was normal. +FM on ultrasound Desires MSAFP at next visit in 2 weeks To RTO or ER if bleeding heavy Farrel Connersolleen Lannette Avellino, CNM

## 2018-02-10 NOTE — Progress Notes (Signed)
No concerns.rj 

## 2018-02-25 ENCOUNTER — Ambulatory Visit (INDEPENDENT_AMBULATORY_CARE_PROVIDER_SITE_OTHER): Payer: Medicaid Other | Admitting: Certified Nurse Midwife

## 2018-02-25 VITALS — BP 100/60 | Wt 157.0 lb

## 2018-02-25 DIAGNOSIS — Z3A17 17 weeks gestation of pregnancy: Secondary | ICD-10-CM

## 2018-02-25 DIAGNOSIS — Z3400 Encounter for supervision of normal first pregnancy, unspecified trimester: Secondary | ICD-10-CM

## 2018-02-25 DIAGNOSIS — Z1379 Encounter for other screening for genetic and chromosomal anomalies: Secondary | ICD-10-CM

## 2018-02-25 DIAGNOSIS — Z363 Encounter for antenatal screening for malformations: Secondary | ICD-10-CM

## 2018-02-25 DIAGNOSIS — Z3402 Encounter for supervision of normal first pregnancy, second trimester: Secondary | ICD-10-CM

## 2018-02-25 LAB — POCT URINALYSIS DIPSTICK OB: Glucose, UA: NEGATIVE

## 2018-02-25 NOTE — Progress Notes (Signed)
ROB at 17wk2d: Still with daily spotting-brown colored discharge-has to wear miniliner-not saturating pad in 24 hours. No cramping No fetal movement. Desires MSAFP FHT WNL. FH at U-2FB MSAFP drawn ROB in 2-3 weeks and anatomy scan  Farrel Conners, CNM

## 2018-02-25 NOTE — Progress Notes (Signed)
No concerns.rj 

## 2018-02-27 LAB — AFP, SERUM, OPEN SPINA BIFIDA
AFP MOM: 2.14
AFP VALUE AFPOSL: 80.2 ng/mL
Gest. Age on Collection Date: 17.3 weeks
Maternal Age At EDD: 21 yr
OSBR RISK 1 IN: 594
Test Results:: NEGATIVE
WEIGHT: 157 [lb_av]

## 2018-03-12 ENCOUNTER — Ambulatory Visit (INDEPENDENT_AMBULATORY_CARE_PROVIDER_SITE_OTHER): Payer: Medicaid Other

## 2018-03-12 ENCOUNTER — Ambulatory Visit (INDEPENDENT_AMBULATORY_CARE_PROVIDER_SITE_OTHER): Payer: Medicaid Other | Admitting: Maternal Newborn

## 2018-03-12 ENCOUNTER — Encounter: Payer: Self-pay | Admitting: Maternal Newborn

## 2018-03-12 VITALS — BP 100/70 | Wt 158.0 lb

## 2018-03-12 DIAGNOSIS — Z3689 Encounter for other specified antenatal screening: Secondary | ICD-10-CM

## 2018-03-12 DIAGNOSIS — Z3400 Encounter for supervision of normal first pregnancy, unspecified trimester: Secondary | ICD-10-CM

## 2018-03-12 DIAGNOSIS — Z363 Encounter for antenatal screening for malformations: Secondary | ICD-10-CM | POA: Diagnosis not present

## 2018-03-12 DIAGNOSIS — O99612 Diseases of the digestive system complicating pregnancy, second trimester: Secondary | ICD-10-CM

## 2018-03-12 DIAGNOSIS — Z3A19 19 weeks gestation of pregnancy: Secondary | ICD-10-CM

## 2018-03-12 DIAGNOSIS — K5900 Constipation, unspecified: Secondary | ICD-10-CM

## 2018-03-12 LAB — POCT URINALYSIS DIPSTICK OB: GLUCOSE, UA: NEGATIVE

## 2018-03-12 MED ORDER — DOCUSATE SODIUM 100 MG PO CAPS: 100.0000 mg | ORAL_CAPSULE | Freq: Two times a day (BID) | ORAL | 2 refills | Status: DC | PRN

## 2018-03-12 NOTE — Patient Instructions (Signed)

## 2018-03-12 NOTE — Progress Notes (Signed)
No concerns.rj 

## 2018-03-12 NOTE — Progress Notes (Signed)
    Routine Prenatal Care Visit  Subjective  Shelby Alvarez is a 22 y.o. G1P0 at 2293w3d being seen today for ongoing prenatal care.  She is currently monitored for the following issues for this low-risk pregnancy and has Supervision of normal first pregnancy, antepartum and Second trimester bleeding on their problem list.  ----------------------------------------------------------------------------------- Patient reports constipation. Her spotting stopped about a week ago and she is not having any abnormal vaginal discharge. She can feel some shifting of baby's position but not small movements yet. Contractions: Not present. Vag. Bleeding: None.  Movement: Present. No leaking of fluid.  ----------------------------------------------------------------------------------- The following portions of the patient's history were reviewed and updated as appropriate: allergies, current medications, past family history, past medical history, past social history, past surgical history and problem list. Problem list updated.  Objective  Blood pressure 100/70, weight 158 lb (71.7 kg), last menstrual period 10/12/2017. Pregravid weight 155 lb (70.3 kg) Total Weight Gain 3 lb (1.361 kg) Body mass index is 31.91 kg/m. Urinalysis: Protein Trace, Glucose Negative Fetal Status: Fetal Heart Rate (bpm): 162   Movement: Present     General:  Alert, oriented and cooperative. Patient is in no acute distress.  Skin: Skin is warm and dry. No rash noted.   Cardiovascular: Normal heart rate noted  Respiratory: Normal respiratory effort, no problems with respiration noted  Abdomen: Soft, gravid, appropriate for gestational age. Pain/Pressure: Absent     Pelvic:  Cervical exam deferred        Extremities: Normal range of motion.  Edema: None  Mental Status: Normal mood and affect. Normal behavior. Normal judgment and thought content.     Assessment   22 y.o. G1P0 at 10993w3d, EDD 08/03/2018 by Ultrasound presenting  for a routine prenatal visit.  Plan   Pregnancy#1 Problems (from 10/12/17 to present)    Problem Noted Resolved   Supervision of normal first pregnancy, antepartum 01/13/2018 by Vena AustriaStaebler, Andreas, MD No   Overview Addendum 02/03/2018  8:25 AM by Vena AustriaStaebler, Andreas, MD    Clinic [facility] Prenatal [facility]  Dating 10 week US Blood type: B positive  Genetic Screen NIPS: negative XX SMA: neg FragileX: neg CF: neg Antibody:negative  Anatomic US  Rubella: Immune Varicella: Immune  GTT  RPR: NR  Rhogam N/A HBsAg: negative  TDaP vaccine                       Flu Shot: HIV: negative  Baby Food                                GBS:   Contraception  GBS prior pregnancy: N/A  Pelvis Tested  N/A Pap: N/A <20  CS/VBAC  HgbAA  Support Person            Anatomy scan normal but incomplete today, follow up next visit.  Recommended increasing fluids, fiber and exercise to help with constipation. Rx sent for stool softener.  Gestational age appropriate obstetric precautions were reviewed.  Please refer to After Visit Summary for other counseling recommendations.   Return in about 4 weeks (around 04/09/2018) for ROB and follow up anatomy.  Marcelyn BruinsJacelyn Lexey Fletes, CNM 03/12/2018  11:34 AM

## 2018-04-09 ENCOUNTER — Encounter: Payer: Self-pay | Admitting: Maternal Newborn

## 2018-04-09 ENCOUNTER — Ambulatory Visit (INDEPENDENT_AMBULATORY_CARE_PROVIDER_SITE_OTHER): Payer: Medicaid Other | Admitting: Maternal Newborn

## 2018-04-09 ENCOUNTER — Ambulatory Visit (INDEPENDENT_AMBULATORY_CARE_PROVIDER_SITE_OTHER): Payer: Medicaid Other

## 2018-04-09 VITALS — BP 120/80 | Wt 162.0 lb

## 2018-04-09 DIAGNOSIS — Z3A23 23 weeks gestation of pregnancy: Secondary | ICD-10-CM

## 2018-04-09 DIAGNOSIS — Z3400 Encounter for supervision of normal first pregnancy, unspecified trimester: Secondary | ICD-10-CM

## 2018-04-09 DIAGNOSIS — Z362 Encounter for other antenatal screening follow-up: Secondary | ICD-10-CM | POA: Diagnosis not present

## 2018-04-09 DIAGNOSIS — Z3402 Encounter for supervision of normal first pregnancy, second trimester: Secondary | ICD-10-CM

## 2018-04-09 DIAGNOSIS — Z3689 Encounter for other specified antenatal screening: Secondary | ICD-10-CM

## 2018-04-09 LAB — POCT URINALYSIS DIPSTICK OB
Glucose, UA: NEGATIVE
POC,PROTEIN,UA: NEGATIVE

## 2018-04-09 NOTE — Progress Notes (Signed)
    Routine Prenatal Care Visit  Subjective  Shelby Alvarez is a 22 y.o. G1P0 at [redacted]w[redacted]d being seen today for ongoing prenatal care.  She is currently monitored for the following issues for this low-risk pregnancy and has Supervision of normal first pregnancy, antepartum and Second trimester bleeding on their problem list.  ----------------------------------------------------------------------------------- Patient reports some swelling in her extremities and nasal congestion. Contractions: Not present. Vag. Bleeding: None.  Movement: Present. No leaking of fluid.  ----------------------------------------------------------------------------------- The following portions of the patient's history were reviewed and updated as appropriate: allergies, current medications, past family history, past medical history, past social history, past surgical history and problem list. Problem list updated.   Objective  Blood pressure 120/80, weight 162 lb (73.5 kg), last menstrual period 10/12/2017. Pregravid weight 155 lb (70.3 kg) Total Weight Gain 7 lb (3.175 kg) Body mass index is 32.72 kg/m.   Urinalysis: Protein Negative, Glucose Negative  Fetal Status:     Movement: Present     General:  Alert, oriented and cooperative. Patient is in no acute distress.  Skin: Skin is warm and dry. No rash noted.   Cardiovascular: Normal heart rate noted  Respiratory: Normal respiratory effort, no problems with respiration noted  Abdomen: Soft, gravid, appropriate for gestational age. Pain/Pressure: Present     Pelvic:  Cervical exam deferred        Extremities: Normal range of motion.     Mental Status: Normal mood and affect. Normal behavior. Normal judgment and thought content.    Assessment   22 y.o. G1P0 at [redacted]w[redacted]d, EDD 08/03/2018 by Ultrasound presenting for a routine prenatal visit.  Plan   Pregnancy#1 Problems (from 10/12/17 to present)    Problem Noted Resolved   Supervision of normal first  pregnancy, antepartum 01/13/2018 by Vena Austria, MD No   Overview Addendum 02/03/2018  8:25 AM by Vena Austria, MD    Clinic [facility] Prenatal [facility]  Dating 10 week [place] Blood type: B positive  Genetic Screen NIPS: negative XX SMA: neg FragileX: neg CF: neg Antibody:negative  Anatomic [place]  Rubella: Immune Varicella: Immune  GTT  RPR: NR  Rhogam N/A HBsAg: negative  TDaP vaccine                       Flu Shot: HIV: negative  Baby Food                                GBS:   Contraception  GBS prior pregnancy: N/A  Pelvis Tested  N/A Pap: N/A <20  CS/VBAC  HgbAA  Support Person            Anatomy scan complete and normal today. Discussed 28 week labs and GTT next visit.  Gestational age appropriate obstetric precautions were reviewed.  Return in about 4 weeks (around 05/07/2018) for ROB with GTT/28 week labs.  Marcelyn Bruins, CNM 04/09/2018  10:51 AM

## 2018-05-07 ENCOUNTER — Encounter: Payer: Self-pay | Admitting: Advanced Practice Midwife

## 2018-05-07 ENCOUNTER — Other Ambulatory Visit: Payer: Medicaid Other

## 2018-05-07 ENCOUNTER — Ambulatory Visit (INDEPENDENT_AMBULATORY_CARE_PROVIDER_SITE_OTHER): Payer: Medicaid Other | Admitting: Advanced Practice Midwife

## 2018-05-07 VITALS — BP 118/74 | Wt 170.0 lb

## 2018-05-07 DIAGNOSIS — Z3400 Encounter for supervision of normal first pregnancy, unspecified trimester: Secondary | ICD-10-CM

## 2018-05-07 DIAGNOSIS — Z3402 Encounter for supervision of normal first pregnancy, second trimester: Secondary | ICD-10-CM

## 2018-05-07 DIAGNOSIS — Z3A27 27 weeks gestation of pregnancy: Secondary | ICD-10-CM

## 2018-05-07 NOTE — Progress Notes (Signed)
No vb. No lof. Gtt today.  

## 2018-05-07 NOTE — Progress Notes (Signed)
  Routine Prenatal Care Visit  Subjective  Shelby Alvarez is a 22 y.o. G1P0 at 4231w3d being seen today for ongoing prenatal care.  She is currently monitored for the following issues for this low-risk pregnancy and has Supervision of normal first pregnancy, antepartum and Second trimester bleeding on their problem list.  ----------------------------------------------------------------------------------- Patient reports back pain. Reviewed comfort measures.   Contractions: Not present. Vag. Bleeding: None.  Movement: Present. Denies leaking of fluid.  ----------------------------------------------------------------------------------- The following portions of the patient's history were reviewed and updated as appropriate: allergies, current medications, past family history, past medical history, past social history, past surgical history and problem list. Problem list updated.   Objective  Blood pressure 118/74, weight 170 lb (77.1 kg), last menstrual period 10/12/2017. Pregravid weight 155 lb (70.3 kg) Total Weight Gain 15 lb (6.804 kg) Urinalysis: Urine Protein    Urine Glucose    Fetal Status: Fetal Heart Rate (bpm): 153 Fundal Height: 27 cm Movement: Present     General:  Alert, oriented and cooperative. Patient is in no acute distress.  Skin: Skin is warm and dry. No rash noted.   Cardiovascular: Normal heart rate noted  Respiratory: Normal respiratory effort, no problems with respiration noted  Abdomen: Soft, gravid, appropriate for gestational age. Pain/Pressure: Absent     Pelvic:  Cervical exam deferred        Extremities: Normal range of motion.  Edema: None  Mental Status: Normal mood and affect. Normal behavior. Normal judgment and thought content.   Assessment   22 y.o. G1P0 at 4131w3d by  08/03/2018, by Ultrasound presenting for routine prenatal visit  Plan   Pregnancy#1 Problems (from 10/12/17 to present)    Problem Noted Resolved   Supervision of normal first  pregnancy, antepartum 01/13/2018 by Vena AustriaStaebler, Andreas, MD No   Overview Addendum 02/03/2018  8:25 AM by Vena AustriaStaebler, Andreas, MD    Clinic [facility] Prenatal Labs  Dating 10 week US Blood type: B positive  Genetic Screen NIPS: negative XX SMA: neg FragileX: neg CF: neg Antibody:negative  Anatomic US  Rubella: Immune Varicella: Immune  GTT  RPR: NR  Rhogam N/A HBsAg: negative  TDaP vaccine                       Flu Shot: HIV: negative  Baby Food                                GBS:   Contraception  GBS prior pregnancy: N/A  Pelvis Tested  N/A Pap: N/A <20  CS/VBAC  HgbAA  Support Person               Preterm labor symptoms and general obstetric precautions including but not limited to vaginal bleeding, contractions, leaking of fluid and fetal movement were reviewed in detail with the patient. Please refer to After Visit Summary for other counseling recommendations.   Return in about 2 weeks (around 05/21/2018) for rob.  Shelby Alvarez, CNM 05/07/2018 8:46 AM

## 2018-05-07 NOTE — Patient Instructions (Signed)
Third Trimester of Pregnancy The third trimester is from week 28 through week 40 (months 7 through 9). The third trimester is a time when the unborn baby (fetus) is growing rapidly. At the end of the ninth month, the fetus is about 20 inches in length and weighs 6-10 pounds. Body changes during your third trimester Your body will continue to go through many changes during pregnancy. The changes vary from woman to woman. During the third trimester:  Your weight will continue to increase. You can expect to gain 25-35 pounds (11-16 kg) by the end of the pregnancy.  You may begin to get stretch marks on your hips, abdomen, and breasts.  You may urinate more often because the fetus is moving lower into your pelvis and pressing on your bladder.  You may develop or continue to have heartburn. This is caused by increased hormones that slow down muscles in the digestive tract.  You may develop or continue to have constipation because increased hormones slow digestion and cause the muscles that push waste through your intestines to relax.  You may develop hemorrhoids. These are swollen veins (varicose veins) in the rectum that can itch or be painful.  You may develop swollen, bulging veins (varicose veins) in your legs.  You may have increased body aches in the pelvis, back, or thighs. This is due to weight gain and increased hormones that are relaxing your joints.  You may have changes in your hair. These can include thickening of your hair, rapid growth, and changes in texture. Some women also have hair loss during or after pregnancy, or hair that feels dry or thin. Your hair will most likely return to normal after your baby is born.  Your breasts will continue to grow and they will continue to become tender. A yellow fluid (colostrum) may leak from your breasts. This is the first milk you are producing for your baby.  Your belly button may stick out.  You may notice more swelling in your hands,  face, or ankles.  You may have increased tingling or numbness in your hands, arms, and legs. The skin on your belly may also feel numb.  You may feel short of breath because of your expanding uterus.  You may have more problems sleeping. This can be caused by the size of your belly, increased need to urinate, and an increase in your body's metabolism.  You may notice the fetus "dropping," or moving lower in your abdomen (lightening).  You may have increased vaginal discharge.  You may notice your joints feel loose and you may have pain around your pelvic bone.  What to expect at prenatal visits You will have prenatal exams every 2 weeks until week 36. Then you will have weekly prenatal exams. During a routine prenatal visit:  You will be weighed to make sure you and the baby are growing normally.  Your blood pressure will be taken.  Your abdomen will be measured to track your baby's growth.  The fetal heartbeat will be listened to.  Any test results from the previous visit will be discussed.  You may have a cervical check near your due date to see if your cervix has softened or thinned (effaced).  You will be tested for Group B streptococcus. This happens between 35 and 37 weeks.  Your health care provider may ask you:  What your birth plan is.  How you are feeling.  If you are feeling the baby move.  If you have had   any abnormal symptoms, such as leaking fluid, bleeding, severe headaches, or abdominal cramping.  If you are using any tobacco products, including cigarettes, chewing tobacco, and electronic cigarettes.  If you have any questions.  Other tests or screenings that may be performed during your third trimester include:  Blood tests that check for low iron levels (anemia).  Fetal testing to check the health, activity level, and growth of the fetus. Testing is done if you have certain medical conditions or if there are problems during the  pregnancy.  Nonstress test (NST). This test checks the health of your baby to make sure there are no signs of problems, such as the baby not getting enough oxygen. During this test, a belt is placed around your belly. The baby is made to move, and its heart rate is monitored during movement.  What is false labor? False labor is a condition in which you feel small, irregular tightenings of the muscles in the womb (contractions) that usually go away with rest, changing position, or drinking water. These are called Braxton Hicks contractions. Contractions may last for hours, days, or even weeks before true labor sets in. If contractions come at regular intervals, become more frequent, increase in intensity, or become painful, you should see your health care provider. What are the signs of labor?  Abdominal cramps.  Regular contractions that start at 10 minutes apart and become stronger and more frequent with time.  Contractions that start on the top of the uterus and spread down to the lower abdomen and back.  Increased pelvic pressure and dull back pain.  A watery or bloody mucus discharge that comes from the vagina.  Leaking of amniotic fluid. This is also known as your "water breaking." It could be a slow trickle or a gush. Let your health care provider know if it has a color or strange odor. If you have any of these signs, call your health care provider right away, even if it is before your due date. Follow these instructions at home: Medicines  Follow your health care provider's instructions regarding medicine use. Specific medicines may be either safe or unsafe to take during pregnancy.  Take a prenatal vitamin that contains at least 600 micrograms (mcg) of folic acid.  If you develop constipation, try taking a stool softener if your health care provider approves. Eating and drinking  Eat a balanced diet that includes fresh fruits and vegetables, whole grains, good sources of protein  such as meat, eggs, or tofu, and low-fat dairy. Your health care provider will help you determine the amount of weight gain that is right for you.  Avoid raw meat and uncooked cheese. These carry germs that can cause birth defects in the baby.  If you have low calcium intake from food, talk to your health care provider about whether you should take a daily calcium supplement.  Eat four or five small meals rather than three large meals a day.  Limit foods that are high in fat and processed sugars, such as fried and sweet foods.  To prevent constipation: ? Drink enough fluid to keep your urine clear or pale yellow. ? Eat foods that are high in fiber, such as fresh fruits and vegetables, whole grains, and beans. Activity  Exercise only as directed by your health care provider. Most women can continue their usual exercise routine during pregnancy. Try to exercise for 30 minutes at least 5 days a week. Stop exercising if you experience uterine contractions.  Avoid heavy   lifting.  Do not exercise in extreme heat or humidity, or at high altitudes.  Wear low-heel, comfortable shoes.  Practice good posture.  You may continue to have sex unless your health care provider tells you otherwise. Relieving pain and discomfort  Take frequent breaks and rest with your legs elevated if you have leg cramps or low back pain.  Take warm sitz baths to soothe any pain or discomfort caused by hemorrhoids. Use hemorrhoid cream if your health care provider approves.  Wear a good support bra to prevent discomfort from breast tenderness.  If you develop varicose veins: ? Wear support pantyhose or compression stockings as told by your healthcare provider. ? Elevate your feet for 15 minutes, 3-4 times a day. Prenatal care  Write down your questions. Take them to your prenatal visits.  Keep all your prenatal visits as told by your health care provider. This is important. Safety  Wear your seat belt at  all times when driving.  Make a list of emergency phone numbers, including numbers for family, friends, the hospital, and police and fire departments. General instructions  Avoid cat litter boxes and soil used by cats. These carry germs that can cause birth defects in the baby. If you have a cat, ask someone to clean the litter box for you.  Do not travel far distances unless it is absolutely necessary and only with the approval of your health care provider.  Do not use hot tubs, steam rooms, or saunas.  Do not drink alcohol.  Do not use any products that contain nicotine or tobacco, such as cigarettes and e-cigarettes. If you need help quitting, ask your health care provider.  Do not use any medicinal herbs or unprescribed drugs. These chemicals affect the formation and growth of the baby.  Do not douche or use tampons or scented sanitary pads.  Do not cross your legs for long periods of time.  To prepare for the arrival of your baby: ? Take prenatal classes to understand, practice, and ask questions about labor and delivery. ? Make a trial run to the hospital. ? Visit the hospital and tour the maternity area. ? Arrange for maternity or paternity leave through employers. ? Arrange for family and friends to take care of pets while you are in the hospital. ? Purchase a rear-facing car seat and make sure you know how to install it in your car. ? Pack your hospital bag. ? Prepare the baby's nursery. Make sure to remove all pillows and stuffed animals from the baby's crib to prevent suffocation.  Visit your dentist if you have not gone during your pregnancy. Use a soft toothbrush to brush your teeth and be gentle when you floss. Contact a health care provider if:  You are unsure if you are in labor or if your water has broken.  You become dizzy.  You have mild pelvic cramps, pelvic pressure, or nagging pain in your abdominal area.  You have lower back pain.  You have persistent  nausea, vomiting, or diarrhea.  You have an unusual or bad smelling vaginal discharge.  You have pain when you urinate. Get help right away if:  Your water breaks before 37 weeks.  You have regular contractions less than 5 minutes apart before 37 weeks.  You have a fever.  You are leaking fluid from your vagina.  You have spotting or bleeding from your vagina.  You have severe abdominal pain or cramping.  You have rapid weight loss or weight gain.    You have shortness of breath with chest pain.  You notice sudden or extreme swelling of your face, hands, ankles, feet, or legs.  Your baby makes fewer than 10 movements in 2 hours.  You have severe headaches that do not go away when you take medicine.  You have vision changes. Summary  The third trimester is from week 28 through week 40, months 7 through 9. The third trimester is a time when the unborn baby (fetus) is growing rapidly.  During the third trimester, your discomfort may increase as you and your baby continue to gain weight. You may have abdominal, leg, and back pain, sleeping problems, and an increased need to urinate.  During the third trimester your breasts will keep growing and they will continue to become tender. A yellow fluid (colostrum) may leak from your breasts. This is the first milk you are producing for your baby.  False labor is a condition in which you feel small, irregular tightenings of the muscles in the womb (contractions) that eventually go away. These are called Braxton Hicks contractions. Contractions may last for hours, days, or even weeks before true labor sets in.  Signs of labor can include: abdominal cramps; regular contractions that start at 10 minutes apart and become stronger and more frequent with time; watery or bloody mucus discharge that comes from the vagina; increased pelvic pressure and dull back pain; and leaking of amniotic fluid. This information is not intended to replace advice  given to you by your health care provider. Make sure you discuss any questions you have with your health care provider. Document Released: 06/05/2001 Document Revised: 11/17/2015 Document Reviewed: 08/12/2012 Elsevier Interactive Patient Education  2017 [employer]  

## 2018-05-08 LAB — 28 WEEK RH+PANEL
BASOS: 0 %
Basophils Absolute: 0 10*3/uL (ref 0.0–0.2)
EOS (ABSOLUTE): 0.1 10*3/uL (ref 0.0–0.4)
Eos: 1 %
Gestational Diabetes Screen: 117 mg/dL (ref 65–139)
HEMOGLOBIN: 13.9 g/dL (ref 11.1–15.9)
HIV SCREEN 4TH GENERATION: NONREACTIVE
Hematocrit: 41.2 % (ref 34.0–46.6)
IMMATURE GRANS (ABS): 0.1 10*3/uL (ref 0.0–0.1)
Immature Granulocytes: 1 %
LYMPHS: 19 %
Lymphocytes Absolute: 1.9 10*3/uL (ref 0.7–3.1)
MCH: 29.2 pg (ref 26.6–33.0)
MCHC: 33.7 g/dL (ref 31.5–35.7)
MCV: 87 fL (ref 79–97)
MONOCYTES: 6 %
Monocytes Absolute: 0.6 10*3/uL (ref 0.1–0.9)
NEUTROS ABS: 7.3 10*3/uL — AB (ref 1.4–7.0)
Neutrophils: 73 %
Platelets: 236 10*3/uL (ref 150–450)
RBC: 4.76 x10E6/uL (ref 3.77–5.28)
RDW: 13.2 % (ref 12.3–15.4)
RPR Ser Ql: NONREACTIVE
WBC: 9.9 10*3/uL (ref 3.4–10.8)

## 2018-05-21 ENCOUNTER — Ambulatory Visit (INDEPENDENT_AMBULATORY_CARE_PROVIDER_SITE_OTHER): Payer: Medicaid Other | Admitting: Advanced Practice Midwife

## 2018-05-21 ENCOUNTER — Encounter: Payer: Self-pay | Admitting: Advanced Practice Midwife

## 2018-05-21 VITALS — BP 118/70 | Wt 173.0 lb

## 2018-05-21 DIAGNOSIS — Z3A29 29 weeks gestation of pregnancy: Secondary | ICD-10-CM

## 2018-05-21 NOTE — Progress Notes (Signed)
ROB No concerns 

## 2018-05-21 NOTE — Progress Notes (Signed)
  Routine Prenatal Care Visit  Subjective  Shelby Alvarez is a 22 y.o. G1P0 at 7562w3d being seen today for ongoing prenatal care.  She is currently monitored for the following issues for this low-risk pregnancy and has Supervision of normal first pregnancy, antepartum and Second trimester bleeding on their problem list.  ----------------------------------------------------------------------------------- Patient reports no complaints.   Contractions: Not present. Vag. Bleeding: None.  Movement: Present. Denies leaking of fluid.  ----------------------------------------------------------------------------------- The following portions of the patient's history were reviewed and updated as appropriate: allergies, current medications, past family history, past medical history, past social history, past surgical history and problem list. Problem list updated.   Objective  Blood pressure 118/70, weight 173 lb (78.5 kg), last menstrual period 10/12/2017. Pregravid weight 155 lb (70.3 kg) Total Weight Gain 18 lb (8.165 kg) Urinalysis: Urine Protein    Urine Glucose    Fetal Status: Fetal Heart Rate (bpm): 155 Fundal Height: 30 cm Movement: Present     General:  Alert, oriented and cooperative. Patient is in no acute distress.  Skin: Skin is warm and dry. No rash noted.   Cardiovascular: Normal heart rate noted  Respiratory: Normal respiratory effort, no problems with respiration noted  Abdomen: Soft, gravid, appropriate for gestational age. Pain/Pressure: Absent     Pelvic:  Cervical exam deferred        Extremities: Normal range of motion.  Edema: None  Mental Status: Normal mood and affect. Normal behavior. Normal judgment and thought content.   Assessment   22 y.o. G1P0 at 3862w3d by  08/03/2018, by Ultrasound presenting for routine prenatal visit  Plan   Pregnancy#1 Problems (from 10/12/17 to present)    Problem Noted Resolved   Supervision of normal first pregnancy, antepartum  01/13/2018 by Vena AustriaStaebler, Andreas, MD No   Overview Addendum 02/03/2018  8:25 AM by Vena AustriaStaebler, Andreas, MD    Clinic [facility] Prenatal Labs  Dating 10 week US Blood type: B positive  Genetic Screen NIPS: negative XX SMA: neg FragileX: neg CF: neg Antibody:negative  Anatomic US  Rubella: Immune Varicella: Immune  GTT  RPR: NR  Rhogam N/A HBsAg: negative  TDaP vaccine                       Flu Shot: HIV: negative  Baby Food                                GBS:   Contraception  GBS prior pregnancy: N/A  Pelvis Tested  N/A Pap: N/A <20  CS/VBAC  HgbAA  Support Person               Preterm labor symptoms and general obstetric precautions including but not limited to vaginal bleeding, contractions, leaking of fluid and fetal movement were reviewed in detail with the patient.   Return in about 2 weeks (around 06/04/2018) for rob.  Shelby Alvarez, CNM 05/21/2018 10:58 AM

## 2018-06-03 ENCOUNTER — Encounter: Payer: Self-pay | Admitting: Maternal Newborn

## 2018-06-03 ENCOUNTER — Ambulatory Visit (INDEPENDENT_AMBULATORY_CARE_PROVIDER_SITE_OTHER): Payer: Medicaid Other | Admitting: Maternal Newborn

## 2018-06-03 VITALS — BP 110/76 | Wt 170.5 lb

## 2018-06-03 DIAGNOSIS — Z3403 Encounter for supervision of normal first pregnancy, third trimester: Secondary | ICD-10-CM

## 2018-06-03 DIAGNOSIS — Z3A31 31 weeks gestation of pregnancy: Secondary | ICD-10-CM

## 2018-06-03 DIAGNOSIS — Z3400 Encounter for supervision of normal first pregnancy, unspecified trimester: Secondary | ICD-10-CM

## 2018-06-03 LAB — POCT URINALYSIS DIPSTICK OB: Glucose, UA: NEGATIVE

## 2018-06-03 NOTE — Patient Instructions (Signed)
Third Trimester of Pregnancy The third trimester is from week 28 through week 40 (months 7 through 9). The third trimester is a time when the unborn baby (fetus) is growing rapidly. At the end of the ninth month, the fetus is about 20 inches in length and weighs 6-10 pounds. Body changes during your third trimester Your body will continue to go through many changes during pregnancy. The changes vary from woman to woman. During the third trimester:  Your weight will continue to increase. You can expect to gain 25-35 pounds (11-16 kg) by the end of the pregnancy.  You may begin to get stretch marks on your hips, abdomen, and breasts.  You may urinate more often because the fetus is moving lower into your pelvis and pressing on your bladder.  You may develop or continue to have heartburn. This is caused by increased hormones that slow down muscles in the digestive tract.  You may develop or continue to have constipation because increased hormones slow digestion and cause the muscles that push waste through your intestines to relax.  You may develop hemorrhoids. These are swollen veins (varicose veins) in the rectum that can itch or be painful.  You may develop swollen, bulging veins (varicose veins) in your legs.  You may have increased body aches in the pelvis, back, or thighs. This is due to weight gain and increased hormones that are relaxing your joints.  You may have changes in your hair. These can include thickening of your hair, rapid growth, and changes in texture. Some women also have hair loss during or after pregnancy, or hair that feels dry or thin. Your hair will most likely return to normal after your baby is born.  Your breasts will continue to grow and they will continue to become tender. A yellow fluid (colostrum) may leak from your breasts. This is the first milk you are producing for your baby.  Your belly button may stick out.  You may notice more swelling in your hands,  face, or ankles.  You may have increased tingling or numbness in your hands, arms, and legs. The skin on your belly may also feel numb.  You may feel short of breath because of your expanding uterus.  You may have more problems sleeping. This can be caused by the size of your belly, increased need to urinate, and an increase in your body's metabolism.  You may notice the fetus "dropping," or moving lower in your abdomen (lightening).  You may have increased vaginal discharge.  You may notice your joints feel loose and you may have pain around your pelvic bone.  What to expect at prenatal visits You will have prenatal exams every 2 weeks until week 36. Then you will have weekly prenatal exams. During a routine prenatal visit:  You will be weighed to make sure you and the baby are growing normally.  Your blood pressure will be taken.  Your abdomen will be measured to track your baby's growth.  The fetal heartbeat will be listened to.  Any test results from the previous visit will be discussed.  You may have a cervical check near your due date to see if your cervix has softened or thinned (effaced).  You will be tested for Group B streptococcus. This happens between 35 and 37 weeks.  Your health care provider may ask you:  What your birth plan is.  How you are feeling.  If you are feeling the baby move.  If you have had   any abnormal symptoms, such as leaking fluid, bleeding, severe headaches, or abdominal cramping.  If you are using any tobacco products, including cigarettes, chewing tobacco, and electronic cigarettes.  If you have any questions.  Other tests or screenings that may be performed during your third trimester include:  Blood tests that check for low iron levels (anemia).  Fetal testing to check the health, activity level, and growth of the fetus. Testing is done if you have certain medical conditions or if there are problems during the  pregnancy.  Nonstress test (NST). This test checks the health of your baby to make sure there are no signs of problems, such as the baby not getting enough oxygen. During this test, a belt is placed around your belly. The baby is made to move, and its heart rate is monitored during movement.  What is false labor? False labor is a condition in which you feel small, irregular tightenings of the muscles in the womb (contractions) that usually go away with rest, changing position, or drinking water. These are called Braxton Hicks contractions. Contractions may last for hours, days, or even weeks before true labor sets in. If contractions come at regular intervals, become more frequent, increase in intensity, or become painful, you should see your health care provider. What are the signs of labor?  Abdominal cramps.  Regular contractions that start at 10 minutes apart and become stronger and more frequent with time.  Contractions that start on the top of the uterus and spread down to the lower abdomen and back.  Increased pelvic pressure and dull back pain.  A watery or bloody mucus discharge that comes from the vagina.  Leaking of amniotic fluid. This is also known as your "water breaking." It could be a slow trickle or a gush. Let your health care provider know if it has a color or strange odor. If you have any of these signs, call your health care provider right away, even if it is before your due date. Follow these instructions at home: Medicines  Follow your health care provider's instructions regarding medicine use. Specific medicines may be either safe or unsafe to take during pregnancy.  Take a prenatal vitamin that contains at least 600 micrograms (mcg) of folic acid.  If you develop constipation, try taking a stool softener if your health care provider approves. Eating and drinking  Eat a balanced diet that includes fresh fruits and vegetables, whole grains, good sources of protein  such as meat, eggs, or tofu, and low-fat dairy. Your health care provider will help you determine the amount of weight gain that is right for you.  Avoid raw meat and uncooked cheese. These carry germs that can cause birth defects in the baby.  If you have low calcium intake from food, talk to your health care provider about whether you should take a daily calcium supplement.  Eat four or five small meals rather than three large meals a day.  Limit foods that are high in fat and processed sugars, such as fried and sweet foods.  To prevent constipation: ? Drink enough fluid to keep your urine clear or pale yellow. ? Eat foods that are high in fiber, such as fresh fruits and vegetables, whole grains, and beans. Activity  Exercise only as directed by your health care provider. Most women can continue their usual exercise routine during pregnancy. Try to exercise for 30 minutes at least 5 days a week. Stop exercising if you experience uterine contractions.  Avoid heavy   lifting.  Do not exercise in extreme heat or humidity, or at high altitudes.  Wear low-heel, comfortable shoes.  Practice good posture.  You may continue to have sex unless your health care provider tells you otherwise. Relieving pain and discomfort  Take frequent breaks and rest with your legs elevated if you have leg cramps or low back pain.  Take warm sitz baths to soothe any pain or discomfort caused by hemorrhoids. Use hemorrhoid cream if your health care provider approves.  Wear a good support bra to prevent discomfort from breast tenderness.  If you develop varicose veins: ? Wear support pantyhose or compression stockings as told by your healthcare provider. ? Elevate your feet for 15 minutes, 3-4 times a day. Prenatal care  Write down your questions. Take them to your prenatal visits.  Keep all your prenatal visits as told by your health care provider. This is important. Safety  Wear your seat belt at  all times when driving.  Make a list of emergency phone numbers, including numbers for family, friends, the hospital, and police and fire departments. General instructions  Avoid cat litter boxes and soil used by cats. These carry germs that can cause birth defects in the baby. If you have a cat, ask someone to clean the litter box for you.  Do not travel far distances unless it is absolutely necessary and only with the approval of your health care provider.  Do not use hot tubs, steam rooms, or saunas.  Do not drink alcohol.  Do not use any products that contain nicotine or tobacco, such as cigarettes and e-cigarettes. If you need help quitting, ask your health care provider.  Do not use any medicinal herbs or unprescribed drugs. These chemicals affect the formation and growth of the baby.  Do not douche or use tampons or scented sanitary pads.  Do not cross your legs for long periods of time.  To prepare for the arrival of your baby: ? Take prenatal classes to understand, practice, and ask questions about labor and delivery. ? Make a trial run to the hospital. ? Visit the hospital and tour the maternity area. ? Arrange for maternity or paternity leave through employers. ? Arrange for family and friends to take care of pets while you are in the hospital. ? Purchase a rear-facing car seat and make sure you know how to install it in your car. ? Pack your hospital bag. ? Prepare the baby's nursery. Make sure to remove all pillows and stuffed animals from the baby's crib to prevent suffocation.  Visit your dentist if you have not gone during your pregnancy. Use a soft toothbrush to brush your teeth and be gentle when you floss. Contact a health care provider if:  You are unsure if you are in labor or if your water has broken.  You become dizzy.  You have mild pelvic cramps, pelvic pressure, or nagging pain in your abdominal area.  You have lower back pain.  You have persistent  nausea, vomiting, or diarrhea.  You have an unusual or bad smelling vaginal discharge.  You have pain when you urinate. Get help right away if:  Your water breaks before 37 weeks.  You have regular contractions less than 5 minutes apart before 37 weeks.  You have a fever.  You are leaking fluid from your vagina.  You have spotting or bleeding from your vagina.  You have severe abdominal pain or cramping.  You have rapid weight loss or weight gain.    You have shortness of breath with chest pain.  You notice sudden or extreme swelling of your face, hands, ankles, feet, or legs.  Your baby makes fewer than 10 movements in 2 hours.  You have severe headaches that do not go away when you take medicine.  You have vision changes. Summary  The third trimester is from week 28 through week 40, months 7 through 9. The third trimester is a time when the unborn baby (fetus) is growing rapidly.  During the third trimester, your discomfort may increase as you and your baby continue to gain weight. You may have abdominal, leg, and back pain, sleeping problems, and an increased need to urinate.  During the third trimester your breasts will keep growing and they will continue to become tender. A yellow fluid (colostrum) may leak from your breasts. This is the first milk you are producing for your baby.  False labor is a condition in which you feel small, irregular tightenings of the muscles in the womb (contractions) that eventually go away. These are called Braxton Hicks contractions. Contractions may last for hours, days, or even weeks before true labor sets in.  Signs of labor can include: abdominal cramps; regular contractions that start at 10 minutes apart and become stronger and more frequent with time; watery or bloody mucus discharge that comes from the vagina; increased pelvic pressure and dull back pain; and leaking of amniotic fluid. This information is not intended to replace advice  given to you by your health care provider. Make sure you discuss any questions you have with your health care provider. Document Released: 06/05/2001 Document Revised: 11/17/2015 Document Reviewed: 08/12/2012 Elsevier Interactive Patient Education  2017 [employer]  

## 2018-06-03 NOTE — Progress Notes (Signed)
    Routine Prenatal Care Visit  Subjective  Shelby PeanJennifer A Grissom is a 22 y.o. G1P0 at 4064w2d being seen today for ongoing prenatal care.  She is currently monitored for the following issues for this low-risk pregnancy and has Supervision of normal first pregnancy, antepartum and Second trimester bleeding on their problem list.  ----------------------------------------------------------------------------------- Patient reports occasional cramping at night and carpal tunnel symptoms.   Contractions: Not present. Vag. Bleeding: None.  Movement: Present. No leaking of fluid.  ----------------------------------------------------------------------------------- The following portions of the patient's history were reviewed and updated as appropriate: allergies, current medications, past family history, past medical history, past social history, past surgical history and problem list. Problem list updated.  Objective  Blood pressure 110/76, weight 170 lb 8 oz (77.3 kg), last menstrual period 10/12/2017. Pregravid weight 155 lb (70.3 kg) Total Weight Gain 15 lb 8 oz (7.031 kg) Body mass index is 34.44 kg/m.  Urinalysis: Protein Small, Glucose Negative  Fetal Status: Fetal Heart Rate (bpm): 160 Fundal Height: 32 cm Movement: Present     General:  Alert, oriented and cooperative. Patient is in no acute distress.  Skin: Skin is warm and dry. No rash noted.   Cardiovascular: Normal heart rate noted  Respiratory: Normal respiratory effort, no problems with respiration noted  Abdomen: Soft, gravid, appropriate for gestational age. Pain/Pressure: Absent     Pelvic:  Cervical exam deferred        Extremities: Normal range of motion.  Edema: None  Mental Status: Normal mood and affect. Normal behavior. Normal judgment and thought content.     Assessment   22 y.o. G1P0 at 464w2d, EDD 08/03/2018 by Ultrasound presenting for a routine prenatal visit.  Plan   Pregnancy#1 Problems (from 10/12/17 to  present)    Problem Noted Resolved   Supervision of normal first pregnancy, antepartum 01/13/2018 by Vena AustriaStaebler, Andreas, MD No   Overview Addendum 02/03/2018  8:25 AM by Vena AustriaStaebler, Andreas, MD    Clinic [facility] Prenatal Labs  Dating 10 week US Blood type: B positive  Genetic Screen NIPS: negative XX SMA: neg FragileX: neg CF: neg Antibody:negative  Anatomic US  Rubella: Immune Varicella: Immune  GTT  RPR: NR  Rhogam N/A HBsAg: negative  TDaP vaccine                       Flu Shot: HIV: negative  Baby Food                                GBS:   Contraception  GBS prior pregnancy: N/A  Pelvis Tested  N/A Pap: N/A <20  CS/VBAC  HgbAA  Support Person            Discussed using wrist braces at night for relief of numbness and tingling in hands.   Preterm labor symptoms and general obstetric precautions were reviewed.  Please refer to After Visit Summary for other counseling recommendations.   Return in about 2 weeks (around 06/17/2018) for ROB.  Marcelyn BruinsJacelyn Breyon Sigg, CNM 06/03/2018  10:21 AM

## 2018-06-03 NOTE — Progress Notes (Signed)
ROB No concerns Declines Tdap BT consent signed today

## 2018-06-16 ENCOUNTER — Ambulatory Visit (INDEPENDENT_AMBULATORY_CARE_PROVIDER_SITE_OTHER): Payer: Medicaid Other | Admitting: Certified Nurse Midwife

## 2018-06-16 VITALS — BP 114/70 | Wt 174.0 lb

## 2018-06-16 DIAGNOSIS — Z3400 Encounter for supervision of normal first pregnancy, unspecified trimester: Secondary | ICD-10-CM

## 2018-06-16 DIAGNOSIS — Z3A33 33 weeks gestation of pregnancy: Secondary | ICD-10-CM

## 2018-06-16 DIAGNOSIS — Z3403 Encounter for supervision of normal first pregnancy, third trimester: Secondary | ICD-10-CM

## 2018-06-16 LAB — POCT URINALYSIS DIPSTICK OB: Glucose, UA: NEGATIVE

## 2018-06-16 NOTE — Progress Notes (Signed)
ROB C/O cramping pains that would last for hours then go away for about 3 days

## 2018-06-25 NOTE — Progress Notes (Signed)
ROB at 33wk1d: Complains of sharp bilateral lower abdominal pains x 3days. NO pain over the last 3 days. No dysuria. Urine does look concentrated. No vaginal bleeding. Baby active Cervix: closed and OOP Encouraged to drink more water May be having round ligament pains. Discussed benefits of maternity support garment. RTO 2 weeks and prn.  Farrel Conners, CNM

## 2018-06-30 ENCOUNTER — Encounter: Payer: Medicaid Other | Admitting: Obstetrics and Gynecology

## 2018-07-02 ENCOUNTER — Ambulatory Visit (INDEPENDENT_AMBULATORY_CARE_PROVIDER_SITE_OTHER): Payer: Medicaid Other | Admitting: Certified Nurse Midwife

## 2018-07-02 VITALS — BP 122/82 | Wt 179.0 lb

## 2018-07-02 DIAGNOSIS — Z3A35 35 weeks gestation of pregnancy: Secondary | ICD-10-CM

## 2018-07-02 DIAGNOSIS — Z3400 Encounter for supervision of normal first pregnancy, unspecified trimester: Secondary | ICD-10-CM

## 2018-07-02 DIAGNOSIS — O1213 Gestational proteinuria, third trimester: Secondary | ICD-10-CM

## 2018-07-02 LAB — POCT URINALYSIS DIPSTICK OB: Glucose, UA: NEGATIVE

## 2018-07-02 NOTE — Patient Instructions (Signed)
.  Preeclampsia and Eclampsia    Preeclampsia is a serious condition that may develop during pregnancy. It is also called toxemia of pregnancy. This condition causes high blood pressure along with other symptoms, such as swelling and headaches. These symptoms may develop as the condition gets worse. Preeclampsia may occur at 20 weeks of pregnancy or later.  Diagnosing and treating preeclampsia early is very important. If not treated early, it can cause serious problems for you and your baby. One problem it can lead to is eclampsia. Eclampsia is a condition that causes muscle jerking or shaking (convulsions or seizures) and other serious problems for the mother. During pregnancy, delivering your baby may be the best treatment for preeclampsia or eclampsia. For most women, preeclampsia and eclampsia symptoms go away after giving birth.  In rare cases, a woman may develop preeclampsia after giving birth (postpartum preeclampsia). This usually occurs within 48 hours after childbirth but may occur up to 6 weeks after giving birth.  What are the causes?  The cause of preeclampsia is not known.  What increases the risk?  The following risk factors make you more likely to develop preeclampsia:   Being pregnant for the first time.   Having had preeclampsia during a past pregnancy.   Having a family history of preeclampsia.   Having high blood pressure.   Being pregnant with more than one baby.   Being 35 or older.   Being African-American.   Having kidney disease or diabetes.   Having medical conditions such as lupus or blood diseases.   Being very overweight (obese).  What are the signs or symptoms?  The earliest signs of preeclampsia are:   High blood pressure.   Increased protein in your urine. Your health care provider will check for this at every visit before you give birth (prenatal visit).  Other symptoms that may develop as the condition gets worse include:   Severe headaches.   Sudden weight  gain.   Swelling of the hands, face, legs, and feet.   Nausea and vomiting.   Vision problems, such as blurred or double vision.   Numbness in the face, arms, legs, and feet.   Urinating less than usual.   Dizziness.   Slurred speech.   Abdominal pain, especially upper abdominal pain.   Convulsions or seizures.  How is this diagnosed?  There are no screening tests for preeclampsia. Your health care provider will ask you about symptoms and check for signs of preeclampsia during your prenatal visits. You may also have tests that include:   Urine tests.   Blood tests.   Checking your blood pressure.   Monitoring your baby's heart rate.   Ultrasound.  How is this treated?  You and your health care provider will determine the treatment approach that is best for you. Treatment may include:   Having more frequent prenatal exams to check for signs of preeclampsia, if you have an increased risk for preeclampsia.   Medicine to lower your blood pressure.   Staying in the hospital, if your condition is severe. There, treatment will focus on controlling your blood pressure and the amount of fluids in your body (fluid retention).   Taking medicine (magnesium sulfate) to prevent seizures. This may be given as an injection or through an IV.   Taking a low-dose aspirin during your pregnancy.   Delivering your baby early, if your condition gets worse. You may have your labor started with medicine (induced), or you may have a cesarean   delivery.  Follow these instructions at home:  Eating and drinking     Drink enough fluid to keep your urine pale yellow.   Avoid caffeine.  Lifestyle   Do not use any products that contain nicotine or tobacco, such as cigarettes and e-cigarettes. If you need help quitting, ask your health care provider.   Do not use alcohol or drugs.   Avoid stress as much as possible. Rest and get plenty of sleep.  General instructions   Take over-the-counter and prescription medicines only as  told by your health care provider.   When lying down, lie on your left side. This keeps pressure off your major blood vessels.   When sitting or lying down, raise (elevate) your feet. Try putting some pillows underneath your lower legs.   Exercise regularly. Ask your health care provider what kinds of exercise are best for you.   Keep all follow-up and prenatal visits as told by your health care provider. This is important.  How is this prevented?  There is no known way of preventing preeclampsia or eclampsia from developing. However, to lower your risk of complications and detect problems early:   Get regular prenatal care. Your health care provider may be able to diagnose and treat the condition early.   Maintain a healthy weight. Ask your health care provider for help managing weight gain during pregnancy.   Work with your health care provider to manage any long-term (chronic) health conditions you have, such as diabetes or kidney problems.   You may have tests of your blood pressure and kidney function after giving birth.   Your health care provider may have you take low-dose aspirin during your next pregnancy.  Contact a health care provider if:   You have symptoms that your health care provider told you may require more treatment or monitoring, such as:  ? Headaches.  ? Nausea or vomiting.  ? Abdominal pain.  ? Dizziness.  ? Light-headedness.  Get help right away if:   You have severe:  ? Abdominal pain.  ? Headaches that do not get better.  ? Dizziness.  ? Vision problems.  ? Confusion.  ? Nausea or vomiting.   You have any of the following:  ? A seizure.  ? Sudden, rapid weight gain.  ? Sudden swelling in your hands, ankles, or face.  ? Trouble moving any part of your body.  ? Numbness in any part of your body.  ? Trouble speaking.  ? Abnormal bleeding.   You faint.  Summary   Preeclampsia is a serious condition that may develop during pregnancy. It is also called toxemia of pregnancy.   This  condition causes high blood pressure along with other symptoms, such as swelling and headaches.   Diagnosing and treating preeclampsia early is very important. If not treated early, it can cause serious problems for you and your baby.   Get help right away if you have symptoms that your health care provider told you to watch for.  This information is not intended to replace advice given to you by your health care provider. Make sure you discuss any questions you have with your health care provider.  Document Released: 06/08/2000 Document Revised: 05/28/2017 Document Reviewed: 01/16/2016  Elsevier Interactive Patient Education  2019 [employer]

## 2018-07-02 NOTE — Progress Notes (Signed)
ROB C/o vaginal pain, trouble walking

## 2018-07-02 NOTE — Progress Notes (Signed)
ROB at 35wk3d: Complains of lower back and pelvic pain as well as increasing pelvic pressure.  No dysuria. Some urinary frequency and voiding in small amounts. No headache, visual changes, RUQ pain, SOB, CP Baby active. Moderate proteinuria. Wt up 5# in 2 weeks. Increased edema in face and hands as well as ankles and feet Blood pressure 120/80, 122/82 Results for orders placed or performed in visit on 07/02/18 (from the past 24 hour(s))  POC Urinalysis Dipstick OB     Status: Abnormal   Collection Time: 07/02/18  2:09 PM  Result Value Ref Range   Color, UA     Clarity, UA     Glucose, UA Negative Negative   Bilirubin, UA     Ketones, UA     Spec Grav, UA     Blood, UA     pH, UA     POC,PROTEIN,UA Moderate (2+) Negative, Trace, Small (1+), Moderate (2+), Large (3+), 4+   Urobilinogen, UA     Nitrite, UA     Leukocytes, UA     Appearance     Odor     A: Moderate proteinuria with normal blood pressures-R/O impending preeclampsia R/O UTI P: urine culture Baseline preeclampsia labs Follow up labs and BP check in 2 days Preeclampsia precautions  Farrel Conners, CNM

## 2018-07-03 LAB — CBC WITH DIFFERENTIAL/PLATELET
BASOS: 0 %
Basophils Absolute: 0 10*3/uL (ref 0.0–0.2)
EOS (ABSOLUTE): 0.1 10*3/uL (ref 0.0–0.4)
EOS: 1 %
Hematocrit: 40.2 % (ref 34.0–46.6)
Hemoglobin: 13.5 g/dL (ref 11.1–15.9)
IMMATURE GRANS (ABS): 0.1 10*3/uL (ref 0.0–0.1)
IMMATURE GRANULOCYTES: 1 %
LYMPHS: 18 %
Lymphocytes Absolute: 1.9 10*3/uL (ref 0.7–3.1)
MCH: 28.8 pg (ref 26.6–33.0)
MCHC: 33.6 g/dL (ref 31.5–35.7)
MCV: 86 fL (ref 79–97)
Monocytes Absolute: 0.7 10*3/uL (ref 0.1–0.9)
Monocytes: 7 %
NEUTROS PCT: 73 %
Neutrophils Absolute: 7.7 10*3/uL — ABNORMAL HIGH (ref 1.4–7.0)
PLATELETS: 219 10*3/uL (ref 150–450)
RBC: 4.68 x10E6/uL (ref 3.77–5.28)
RDW: 13.8 % (ref 11.7–15.4)
WBC: 10.4 10*3/uL (ref 3.4–10.8)

## 2018-07-03 LAB — PROTEIN / CREATININE RATIO, URINE
CREATININE, UR: 192.8 mg/dL
Protein, Ur: 92 mg/dL
Protein/Creat Ratio: 477 mg/g creat — ABNORMAL HIGH (ref 0–200)

## 2018-07-03 LAB — COMPREHENSIVE METABOLIC PANEL
A/G RATIO: 1.6 (ref 1.2–2.2)
ALBUMIN: 3.5 g/dL (ref 3.5–5.5)
ALK PHOS: 124 IU/L — AB (ref 39–117)
ALT: 10 IU/L (ref 0–32)
AST: 15 IU/L (ref 0–40)
BUN/Creatinine Ratio: 19 (ref 9–23)
BUN: 11 mg/dL (ref 6–20)
Bilirubin Total: 0.6 mg/dL (ref 0.0–1.2)
CO2: 19 mmol/L — AB (ref 20–29)
CREATININE: 0.58 mg/dL (ref 0.57–1.00)
Calcium: 9.3 mg/dL (ref 8.7–10.2)
Chloride: 106 mmol/L (ref 96–106)
GFR calc Af Amer: 153 mL/min/{1.73_m2} (ref >59)
GFR, EST NON AFRICAN AMERICAN: 133 mL/min/{1.73_m2} (ref >59)
GLOBULIN, TOTAL: 2.2 g/dL (ref 1.5–4.5)
Glucose: 99 mg/dL (ref 65–99)
POTASSIUM: 4.6 mmol/L (ref 3.5–5.2)
SODIUM: 138 mmol/L (ref 134–144)
Total Protein: 5.7 g/dL — ABNORMAL LOW (ref 6.0–8.5)

## 2018-07-04 ENCOUNTER — Inpatient Hospital Stay: Payer: Medicaid Other

## 2018-07-04 ENCOUNTER — Inpatient Hospital Stay
Admission: EM | Admit: 2018-07-04 | Discharge: 2018-07-06 | DRG: 787 | Disposition: A | Discharge status: Home / Self Care | Payer: Medicaid Other | Attending: Advanced Practice Midwife | Admitting: Advanced Practice Midwife

## 2018-07-04 ENCOUNTER — Encounter: Payer: Medicaid Other | Admitting: Maternal Newborn

## 2018-07-04 ENCOUNTER — Other Ambulatory Visit: Payer: Self-pay

## 2018-07-04 ENCOUNTER — Inpatient Hospital Stay: Payer: Medicaid Other | Admitting: Anesthesiology

## 2018-07-04 ENCOUNTER — Encounter
Admission: EM | Disposition: A | Discharge status: Home / Self Care | Payer: Self-pay | Source: Home / Self Care | Attending: Certified Nurse Midwife

## 2018-07-04 DIAGNOSIS — R109 Unspecified abdominal pain: Secondary | ICD-10-CM

## 2018-07-04 DIAGNOSIS — O1414 Severe pre-eclampsia complicating childbirth: Secondary | ICD-10-CM | POA: Diagnosis present

## 2018-07-04 DIAGNOSIS — D62 Acute posthemorrhagic anemia: Secondary | ICD-10-CM | POA: Diagnosis not present

## 2018-07-04 DIAGNOSIS — Z3400 Encounter for supervision of normal first pregnancy, unspecified trimester: Secondary | ICD-10-CM

## 2018-07-04 DIAGNOSIS — O9081 Anemia of the puerperium: Secondary | ICD-10-CM | POA: Diagnosis not present

## 2018-07-04 DIAGNOSIS — O26893 Other specified pregnancy related conditions, third trimester: Secondary | ICD-10-CM | POA: Diagnosis present

## 2018-07-04 DIAGNOSIS — Z3A35 35 weeks gestation of pregnancy: Secondary | ICD-10-CM

## 2018-07-04 DIAGNOSIS — R1011 Right upper quadrant pain: Secondary | ICD-10-CM

## 2018-07-04 DIAGNOSIS — R1013 Epigastric pain: Secondary | ICD-10-CM | POA: Diagnosis present

## 2018-07-04 DIAGNOSIS — Z9189 Other specified personal risk factors, not elsewhere classified: Secondary | ICD-10-CM

## 2018-07-04 DIAGNOSIS — O1413 Severe pre-eclampsia, third trimester: Secondary | ICD-10-CM | POA: Diagnosis present

## 2018-07-04 LAB — URINE DRUG SCREEN, QUALITATIVE (ARMC ONLY)
Amphetamines, Ur Screen: NOT DETECTED
BARBITURATES, UR SCREEN: NOT DETECTED
Benzodiazepine, Ur Scrn: NOT DETECTED
Cannabinoid 50 Ng, Ur ~~LOC~~: NOT DETECTED
Cocaine Metabolite,Ur ~~LOC~~: NOT DETECTED
MDMA (Ecstasy)Ur Screen: NOT DETECTED
Methadone Scn, Ur: NOT DETECTED
Opiate, Ur Screen: NOT DETECTED
Phencyclidine (PCP) Ur S: NOT DETECTED
TRICYCLIC, UR SCREEN: NOT DETECTED

## 2018-07-04 LAB — CHLAMYDIA/NGC RT PCR (ARMC ONLY)
Chlamydia Tr: NOT DETECTED
N gonorrhoeae: NOT DETECTED

## 2018-07-04 LAB — COMPREHENSIVE METABOLIC PANEL
ALT: 95 U/L — AB (ref 0–44)
ALT: 110 U/L — ABNORMAL HIGH (ref 0–44)
AST: 129 U/L — ABNORMAL HIGH (ref 15–41)
AST: 146 U/L — ABNORMAL HIGH (ref 15–41)
Albumin: 3.1 g/dL — ABNORMAL LOW (ref 3.5–5.0)
Albumin: 3 g/dL — ABNORMAL LOW (ref 3.5–5.0)
Alkaline Phosphatase: 130 U/L — ABNORMAL HIGH (ref 38–126)
Alkaline Phosphatase: 126 U/L (ref 38–126)
Anion gap: 8 (ref 5–15)
Anion gap: 10 (ref 5–15)
BILIRUBIN TOTAL: 0.9 mg/dL (ref 0.3–1.2)
BILIRUBIN TOTAL: 1.1 mg/dL (ref 0.3–1.2)
BUN: 10 mg/dL (ref 6–20)
BUN: 8 mg/dL (ref 6–20)
CO2: 19 mmol/L — ABNORMAL LOW (ref 22–32)
CO2: 17 mmol/L — ABNORMAL LOW (ref 22–32)
CREATININE: 0.5 mg/dL (ref 0.44–1.00)
Calcium: 8.7 mg/dL — ABNORMAL LOW (ref 8.9–10.3)
Calcium: 8.9 mg/dL (ref 8.9–10.3)
Chloride: 108 mmol/L (ref 98–111)
Chloride: 106 mmol/L (ref 98–111)
Creatinine, Ser: 0.52 mg/dL (ref 0.44–1.00)
GFR calc Af Amer: >60 mL/min (ref >60)
GFR calc Af Amer: >60 mL/min (ref >60)
GFR calc non Af Amer: >60 mL/min (ref >60)
GFR calc non Af Amer: >60 mL/min (ref >60)
Glucose, Bld: 98 mg/dL (ref 70–99)
Glucose, Bld: 109 mg/dL — ABNORMAL HIGH (ref 70–99)
Potassium: 3.7 mmol/L (ref 3.5–5.1)
Potassium: 3.9 mmol/L (ref 3.5–5.1)
Sodium: 135 mmol/L (ref 135–145)
Sodium: 133 mmol/L — ABNORMAL LOW (ref 135–145)
TOTAL PROTEIN: 6.2 g/dL — AB (ref 6.5–8.1)
Total Protein: 6.2 g/dL — ABNORMAL LOW (ref 6.5–8.1)

## 2018-07-04 LAB — CBC
HCT: 40.1 % (ref 36.0–46.0)
HCT: 38.6 % (ref 36.0–46.0)
HCT: 39.7 % (ref 36.0–46.0)
HEMATOCRIT: 38.4 % (ref 36.0–46.0)
Hemoglobin: 13.8 g/dL (ref 12.0–15.0)
Hemoglobin: 13.5 g/dL (ref 12.0–15.0)
Hemoglobin: 13.5 g/dL (ref 12.0–15.0)
Hemoglobin: 13.6 g/dL (ref 12.0–15.0)
MCH: 30.1 pg (ref 26.0–34.0)
MCH: 30.4 pg (ref 26.0–34.0)
MCH: 30.3 pg (ref 26.0–34.0)
MCH: 29.6 pg (ref 26.0–34.0)
MCHC: 34.4 g/dL (ref 30.0–36.0)
MCHC: 35.2 g/dL (ref 30.0–36.0)
MCHC: 35 g/dL (ref 30.0–36.0)
MCHC: 34.3 g/dL (ref 30.0–36.0)
MCV: 87.4 fL (ref 80.0–100.0)
MCV: 86.5 fL (ref 80.0–100.0)
MCV: 86.7 fL (ref 80.0–100.0)
MCV: 86.3 fL (ref 80.0–100.0)
Platelets: 171 10*3/uL (ref 150–400)
Platelets: 165 10*3/uL (ref 150–400)
Platelets: 162 10*3/uL (ref 150–400)
Platelets: 170 10*3/uL (ref 150–400)
RBC: 4.59 MIL/uL (ref 3.87–5.11)
RBC: 4.44 MIL/uL (ref 3.87–5.11)
RBC: 4.45 MIL/uL (ref 3.87–5.11)
RBC: 4.6 MIL/uL (ref 3.87–5.11)
RDW: 13.7 % (ref 11.5–15.5)
RDW: 13.7 % (ref 11.5–15.5)
RDW: 13.7 % (ref 11.5–15.5)
RDW: 13.7 % (ref 11.5–15.5)
WBC: 17.3 10*3/uL — ABNORMAL HIGH (ref 4.0–10.5)
WBC: 17.7 10*3/uL — ABNORMAL HIGH (ref 4.0–10.5)
WBC: 16.4 10*3/uL — ABNORMAL HIGH (ref 4.0–10.5)
WBC: 15 10*3/uL — ABNORMAL HIGH (ref 4.0–10.5)
nRBC: 0 % (ref 0.0–0.2)
nRBC: 0 % (ref 0.0–0.2)
nRBC: 0 % (ref 0.0–0.2)
nRBC: 0 % (ref 0.0–0.2)

## 2018-07-04 LAB — TYPE AND SCREEN
ABO/RH(D): B POS
Antibody Screen: NEGATIVE

## 2018-07-04 LAB — PROTEIN / CREATININE RATIO, URINE
Creatinine, Urine: 111 mg/dL
Protein Creatinine Ratio: 0.62 mg/mg{Cre} — ABNORMAL HIGH (ref 0.00–0.15)
Total Protein, Urine: 69 mg/dL

## 2018-07-04 LAB — URINE CULTURE

## 2018-07-04 LAB — GROUP B STREP BY PCR: Group B strep by PCR: NEGATIVE

## 2018-07-04 LAB — HEPATITIS B SURFACE ANTIGEN: Hepatitis B Surface Ag: NEGATIVE

## 2018-07-04 SURGERY — Surgical Case
Anesthesia: Spinal | Site: Abdomen

## 2018-07-04 SURGERY — Surgical Case
Anesthesia: *Unknown

## 2018-07-04 MED ORDER — HYDRALAZINE HCL 20 MG/ML IJ SOLN: 10.0000 mg | INTRAMUSCULAR | Status: DC | PRN

## 2018-07-04 MED ORDER — CARBOPROST TROMETHAMINE 250 MCG/ML IM SOLN
INTRAMUSCULAR | Status: DC | PRN
  Administered 2018-07-04: 250 ug via INTRAMUSCULAR

## 2018-07-04 MED ORDER — LABETALOL HCL 5 MG/ML IV SOLN
20.0000 mg | INTRAVENOUS | Status: DC | PRN
  Filled 2018-07-04: qty 4

## 2018-07-04 MED ORDER — LABETALOL HCL 5 MG/ML IV SOLN
80.0000 mg | INTRAVENOUS | Status: DC | PRN
  Filled 2018-07-04: qty 16

## 2018-07-04 MED ORDER — OXYTOCIN 40 UNITS IN NORMAL SALINE INFUSION - SIMPLE MED
2.5000 [IU]/h | INTRAVENOUS | Status: AC
  Administered 2018-07-04 (×2): 2.5 [IU]/h via INTRAVENOUS

## 2018-07-04 MED ORDER — OXYTOCIN BOLUS FROM INFUSION: 500.0000 mL | Freq: Once | INTRAVENOUS | Status: DC

## 2018-07-04 MED ORDER — ACETAMINOPHEN 500 MG PO TABS
1000.0000 mg | ORAL_TABLET | Freq: Four times a day (QID) | ORAL | Status: DC
  Administered 2018-07-05 – 2018-07-06 (×3): 1000 mg via ORAL
  Filled 2018-07-04 (×3): qty 2

## 2018-07-04 MED ORDER — LABETALOL HCL 5 MG/ML IV SOLN: 40.0000 mg | INTRAVENOUS | Status: DC | PRN

## 2018-07-04 MED ORDER — SENNOSIDES-DOCUSATE SODIUM 8.6-50 MG PO TABS
2.0000 | ORAL_TABLET | ORAL | Status: DC
  Administered 2018-07-05: 2 via ORAL
  Filled 2018-07-04 (×2): qty 2

## 2018-07-04 MED ORDER — LABETALOL HCL 5 MG/ML IV SOLN: 20.0000 mg | INTRAVENOUS | Status: DC | PRN

## 2018-07-04 MED ORDER — OXYTOCIN 40 UNITS IN NORMAL SALINE INFUSION - SIMPLE MED
2.5000 [IU]/h | INTRAVENOUS | Status: DC
  Administered 2018-07-04: 2.5 [IU]/h via INTRAVENOUS
  Filled 2018-07-04 (×2): qty 1000

## 2018-07-04 MED ORDER — OXYTOCIN 40 UNITS IN NORMAL SALINE INFUSION - SIMPLE MED
INTRAVENOUS | Status: DC | PRN
  Administered 2018-07-04: 1000 mL via INTRAVENOUS

## 2018-07-04 MED ORDER — LACTATED RINGERS IV SOLN
INTRAVENOUS | Status: DC
  Administered 2018-07-04: 07:00:00 via INTRAVENOUS

## 2018-07-04 MED ORDER — OXYTOCIN 10 UNIT/ML IJ SOLN
INTRAMUSCULAR | Status: AC
  Filled 2018-07-04: qty 2

## 2018-07-04 MED ORDER — MISOPROSTOL 25 MCG QUARTER TABLET
25.0000 ug | ORAL_TABLET | ORAL | Status: DC | PRN
  Administered 2018-07-04: 25 ug via VAGINAL

## 2018-07-04 MED ORDER — ONDANSETRON HCL 4 MG/2ML IJ SOLN
INTRAMUSCULAR | Status: AC
  Filled 2018-07-04: qty 2

## 2018-07-04 MED ORDER — AMMONIA AROMATIC IN INHA
RESPIRATORY_TRACT | Status: AC
  Filled 2018-07-04: qty 10

## 2018-07-04 MED ORDER — FENTANYL CITRATE (PF) 100 MCG/2ML IJ SOLN
50.0000 ug | Freq: Once | INTRAMUSCULAR | Status: AC
  Administered 2018-07-04: 50 ug via INTRAVENOUS

## 2018-07-04 MED ORDER — MAGNESIUM SULFATE BOLUS VIA INFUSION
4.0000 g | Freq: Once | INTRAVENOUS | Status: AC
  Administered 2018-07-04: 4 g via INTRAVENOUS
  Filled 2018-07-04: qty 500

## 2018-07-04 MED ORDER — FLEET ENEMA 7-19 GM/118ML RE ENEM: 1.0000 | ENEMA | Freq: Every day | RECTAL | Status: DC | PRN

## 2018-07-04 MED ORDER — TERBUTALINE SULFATE 1 MG/ML IJ SOLN
0.2500 mg | Freq: Once | INTRAMUSCULAR | Status: DC | PRN
  Filled 2018-07-04: qty 1

## 2018-07-04 MED ORDER — SOD CITRATE-CITRIC ACID 500-334 MG/5ML PO SOLN
30.0000 mL | ORAL | Status: DC | PRN
  Administered 2018-07-04: 30 mL via ORAL
  Filled 2018-07-04: qty 15

## 2018-07-04 MED ORDER — LIDOCAINE HCL (PF) 1 % IJ SOLN: 30.0000 mL | INTRAMUSCULAR | Status: DC | PRN

## 2018-07-04 MED ORDER — CALCIUM CARBONATE ANTACID 500 MG PO CHEW
CHEWABLE_TABLET | ORAL | Status: AC
  Filled 2018-07-04: qty 2

## 2018-07-04 MED ORDER — ONDANSETRON HCL 4 MG/2ML IJ SOLN: 4.0000 mg | Freq: Once | INTRAMUSCULAR | Status: DC | PRN

## 2018-07-04 MED ORDER — LACTATED RINGERS IV SOLN: 500.0000 mL | INTRAVENOUS | Status: DC | PRN

## 2018-07-04 MED ORDER — CARBOPROST TROMETHAMINE 250 MCG/ML IM SOLN
INTRAMUSCULAR | Status: AC
  Filled 2018-07-04: qty 1

## 2018-07-04 MED ORDER — FENTANYL CITRATE (PF) 100 MCG/2ML IJ SOLN
INTRAMUSCULAR | Status: DC | PRN
  Administered 2018-07-04: 20 ug via INTRATHECAL

## 2018-07-04 MED ORDER — IBUPROFEN 800 MG PO TABS
800.0000 mg | ORAL_TABLET | Freq: Three times a day (TID) | ORAL | Status: DC
  Administered 2018-07-04 – 2018-07-06 (×6): 800 mg via ORAL
  Filled 2018-07-04 (×6): qty 1

## 2018-07-04 MED ORDER — MENTHOL 3 MG MT LOZG
1.0000 | LOZENGE | OROMUCOSAL | Status: DC | PRN
  Filled 2018-07-04: qty 9

## 2018-07-04 MED ORDER — CEFAZOLIN SODIUM-DEXTROSE 2-4 GM/100ML-% IV SOLN
2.0000 g | INTRAVENOUS | Status: AC
  Administered 2018-07-04: 2 g via INTRAVENOUS
  Filled 2018-07-04: qty 100

## 2018-07-04 MED ORDER — COCONUT OIL OIL
1.0000 "application " | TOPICAL_OIL | Status: DC | PRN
  Filled 2018-07-04: qty 120

## 2018-07-04 MED ORDER — SIMETHICONE 80 MG PO CHEW
80.0000 mg | CHEWABLE_TABLET | Freq: Three times a day (TID) | ORAL | Status: DC
  Administered 2018-07-05 – 2018-07-06 (×4): 80 mg via ORAL
  Filled 2018-07-04 (×4): qty 1

## 2018-07-04 MED ORDER — MAGNESIUM SULFATE 40 G IN LACTATED RINGERS - SIMPLE
2.0000 g/h | INTRAVENOUS | Status: DC
  Administered 2018-07-04 – 2018-07-05 (×2): 2 g/h via INTRAVENOUS
  Filled 2018-07-04: qty 500

## 2018-07-04 MED ORDER — ACETAMINOPHEN 325 MG PO TABS
650.0000 mg | ORAL_TABLET | ORAL | Status: DC | PRN
  Administered 2018-07-04: 650 mg via ORAL

## 2018-07-04 MED ORDER — MISOPROSTOL 200 MCG PO TABS
ORAL_TABLET | ORAL | Status: AC
  Filled 2018-07-04: qty 4

## 2018-07-04 MED ORDER — BUTORPHANOL TARTRATE 2 MG/ML IJ SOLN: 1.0000 mg | INTRAMUSCULAR | Status: DC | PRN

## 2018-07-04 MED ORDER — DIBUCAINE 1 % RE OINT: 1.0000 "application " | TOPICAL_OINTMENT | RECTAL | Status: DC | PRN

## 2018-07-04 MED ORDER — BUPIVACAINE 0.25 % ON-Q PUMP DUAL CATH 400 ML
400.0000 mL | INJECTION | Status: DC
  Filled 2018-07-04: qty 400

## 2018-07-04 MED ORDER — DIPHENHYDRAMINE HCL 25 MG PO CAPS: 25.0000 mg | ORAL_CAPSULE | Freq: Four times a day (QID) | ORAL | Status: DC | PRN

## 2018-07-04 MED ORDER — MAGNESIUM SULFATE 40 G IN LACTATED RINGERS - SIMPLE
2.0000 g/h | INTRAVENOUS | Status: DC
  Administered 2018-07-04: 2 g/h via INTRAVENOUS
  Filled 2018-07-04: qty 500

## 2018-07-04 MED ORDER — PRENATAL MULTIVITAMIN CH
1.0000 | ORAL_TABLET | Freq: Every day | ORAL | Status: DC
  Administered 2018-07-05 – 2018-07-06 (×2): 1 via ORAL
  Filled 2018-07-04 (×2): qty 1

## 2018-07-04 MED ORDER — MISOPROSTOL 25 MCG QUARTER TABLET
ORAL_TABLET | ORAL | Status: AC
  Filled 2018-07-04: qty 1

## 2018-07-04 MED ORDER — WITCH HAZEL-GLYCERIN EX PADS: 1.0000 "application " | MEDICATED_PAD | CUTANEOUS | Status: DC | PRN

## 2018-07-04 MED ORDER — HYDROMORPHONE HCL 1 MG/ML IJ SOLN: 0.2000 mg | INTRAMUSCULAR | Status: DC | PRN

## 2018-07-04 MED ORDER — LABETALOL HCL 5 MG/ML IV SOLN: 80.0000 mg | INTRAVENOUS | Status: DC | PRN

## 2018-07-04 MED ORDER — FENTANYL CITRATE (PF) 100 MCG/2ML IJ SOLN
25.0000 ug | INTRAMUSCULAR | Status: DC | PRN
  Administered 2018-07-04 (×2): 25 ug via INTRAVENOUS
  Filled 2018-07-04: qty 2

## 2018-07-04 MED ORDER — SIMETHICONE 80 MG PO CHEW: 80.0000 mg | CHEWABLE_TABLET | ORAL | Status: DC | PRN

## 2018-07-04 MED ORDER — ACETAMINOPHEN 325 MG PO TABS: 650.0000 mg | ORAL_TABLET | ORAL | Status: DC | PRN

## 2018-07-04 MED ORDER — LABETALOL HCL 5 MG/ML IV SOLN
40.0000 mg | INTRAVENOUS | Status: DC | PRN
  Filled 2018-07-04: qty 8

## 2018-07-04 MED ORDER — BUPIVACAINE HCL (PF) 0.5 % IJ SOLN
10.0000 mL | Freq: Once | INTRAMUSCULAR | Status: DC
  Filled 2018-07-04: qty 30

## 2018-07-04 MED ORDER — BETAMETHASONE SOD PHOS & ACET 6 (3-3) MG/ML IJ SUSP
INTRAMUSCULAR | Status: AC
  Filled 2018-07-04: qty 15

## 2018-07-04 MED ORDER — CALCIUM GLUCONATE 10 % IV SOLN
INTRAVENOUS | Status: AC
  Filled 2018-07-04: qty 10

## 2018-07-04 MED ORDER — SODIUM CHLORIDE 0.9 % IV SOLN
INTRAVENOUS | Status: DC | PRN
  Administered 2018-07-04: 50 ug/min via INTRAVENOUS

## 2018-07-04 MED ORDER — OXYTOCIN 40 UNITS IN NORMAL SALINE INFUSION - SIMPLE MED
INTRAVENOUS | Status: AC
  Filled 2018-07-04: qty 1000

## 2018-07-04 MED ORDER — LACTATED RINGERS IV SOLN
INTRAVENOUS | Status: DC
  Administered 2018-07-04: 19:00:00 via INTRAVENOUS

## 2018-07-04 MED ORDER — BISACODYL 10 MG RE SUPP
10.0000 mg | Freq: Every day | RECTAL | Status: DC | PRN
  Filled 2018-07-04: qty 1

## 2018-07-04 MED ORDER — FENTANYL CITRATE (PF) 100 MCG/2ML IJ SOLN
INTRAMUSCULAR | Status: AC
  Filled 2018-07-04: qty 2

## 2018-07-04 MED ORDER — ONDANSETRON HCL 4 MG/2ML IJ SOLN: 4.0000 mg | Freq: Four times a day (QID) | INTRAMUSCULAR | Status: DC | PRN

## 2018-07-04 MED ORDER — HYDROCODONE-ACETAMINOPHEN 5-325 MG PO TABS: 1.0000 | ORAL_TABLET | ORAL | Status: DC | PRN

## 2018-07-04 MED ORDER — EPHEDRINE SULFATE 50 MG/ML IJ SOLN
INTRAMUSCULAR | Status: AC
  Filled 2018-07-04: qty 1

## 2018-07-04 MED ORDER — ACETAMINOPHEN 325 MG PO TABS
ORAL_TABLET | ORAL | Status: AC
  Filled 2018-07-04: qty 2

## 2018-07-04 MED ORDER — BETAMETHASONE SOD PHOS & ACET 6 (3-3) MG/ML IJ SUSP
12.0000 mg | INTRAMUSCULAR | Status: DC
  Administered 2018-07-04: 12 mg via INTRAMUSCULAR

## 2018-07-04 MED ORDER — LOPERAMIDE HCL 2 MG PO CAPS
4.0000 mg | ORAL_CAPSULE | ORAL | Status: DC | PRN
  Administered 2018-07-04: 4 mg via ORAL
  Filled 2018-07-04 (×2): qty 2

## 2018-07-04 MED ORDER — ONDANSETRON HCL 4 MG/2ML IJ SOLN
INTRAMUSCULAR | Status: DC | PRN
  Administered 2018-07-04: 4 mg via INTRAVENOUS

## 2018-07-04 MED ORDER — FENTANYL CITRATE (PF) 100 MCG/2ML IJ SOLN
INTRAMUSCULAR | Status: AC
  Administered 2018-07-04: 50 ug via INTRAVENOUS
  Filled 2018-07-04: qty 2

## 2018-07-04 MED ORDER — SOD CITRATE-CITRIC ACID 500-334 MG/5ML PO SOLN: 30.0000 mL | ORAL | Status: DC

## 2018-07-04 MED ORDER — SIMETHICONE 80 MG PO CHEW
80.0000 mg | CHEWABLE_TABLET | ORAL | Status: DC
  Administered 2018-07-05: 80 mg via ORAL
  Filled 2018-07-04: qty 1

## 2018-07-04 MED ORDER — BUPIVACAINE ON-Q PAIN PUMP (FOR ORDER SET NO CHG)
INJECTION | Status: DC
  Filled 2018-07-04: qty 1

## 2018-07-04 MED ORDER — TERBUTALINE SULFATE 1 MG/ML IJ SOLN
0.2500 mg | Freq: Once | INTRAMUSCULAR | Status: AC | PRN
  Administered 2018-07-04: 0.25 mg via SUBCUTANEOUS

## 2018-07-04 MED ORDER — LIDOCAINE HCL (PF) 1 % IJ SOLN
INTRAMUSCULAR | Status: AC
  Filled 2018-07-04: qty 30

## 2018-07-04 MED ORDER — FERROUS SULFATE 325 (65 FE) MG PO TABS
325.0000 mg | ORAL_TABLET | Freq: Two times a day (BID) | ORAL | Status: DC
  Administered 2018-07-05 – 2018-07-06 (×2): 325 mg via ORAL
  Filled 2018-07-04 (×2): qty 1

## 2018-07-04 MED ORDER — BUPIVACAINE IN DEXTROSE 0.75-8.25 % IT SOLN
INTRATHECAL | Status: DC | PRN
  Administered 2018-07-04: 1.6 mL via INTRATHECAL

## 2018-07-04 MED ORDER — OXYTOCIN 40 UNITS IN NORMAL SALINE INFUSION - SIMPLE MED
1.0000 m[IU]/min | INTRAVENOUS | Status: DC
  Administered 2018-07-04: 2 m[IU]/min via INTRAVENOUS

## 2018-07-04 MED ORDER — BUPIVACAINE HCL (PF) 0.5 % IJ SOLN
INTRAMUSCULAR | Status: DC | PRN
  Administered 2018-07-04: 10 mL

## 2018-07-04 MED ORDER — CALCIUM CARBONATE ANTACID 500 MG PO CHEW
800.0000 mg | CHEWABLE_TABLET | Freq: Once | ORAL | Status: AC
  Administered 2018-07-04: 800 mg via ORAL

## 2018-07-04 SURGICAL SUPPLY — 25 items
CANISTER SUCT 3000ML PPV (MISCELLANEOUS) ×3 IMPLANT
CHLORAPREP W/TINT 26ML (MISCELLANEOUS) ×6 IMPLANT
COVER LIGHT HANDLE STERIS (MISCELLANEOUS) ×2 IMPLANT
DERMABOND ADVANCED (GAUZE/BANDAGES/DRESSINGS) ×2
DERMABOND ADVANCED .7 DNX12 (GAUZE/BANDAGES/DRESSINGS) ×1 IMPLANT
DRSG OPSITE POSTOP 4X10 (GAUZE/BANDAGES/DRESSINGS) ×5 IMPLANT
ELECT CAUTERY BLADE 6.4 (BLADE) ×3 IMPLANT
ELECT REM PT RETURN 9FT ADLT (ELECTROSURGICAL) ×3
ELECTRODE REM PT RTRN 9FT ADLT (ELECTROSURGICAL) ×1 IMPLANT
GLOVE BIOGEL PI IND STRL 6.5 (GLOVE) ×1 IMPLANT
GLOVE BIOGEL PI INDICATOR 6.5 (GLOVE) ×2
GOWN STRL REUS W/ TWL LRG LVL3 (GOWN DISPOSABLE) ×1 IMPLANT
GOWN STRL REUS W/ TWL XL LVL3 (GOWN DISPOSABLE) ×2 IMPLANT
GOWN STRL REUS W/TWL LRG LVL3 (GOWN DISPOSABLE) ×2
GOWN STRL REUS W/TWL XL LVL3 (GOWN DISPOSABLE) ×4
NS IRRIG 1000ML POUR BTL (IV SOLUTION) ×3 IMPLANT
PACK C SECTION AR (MISCELLANEOUS) ×3 IMPLANT
PAD OB MATERNITY 4.3X12.25 (PERSONAL CARE ITEMS) ×3 IMPLANT
PAD PREP 24X41 OB/GYN DISP (PERSONAL CARE ITEMS) ×3 IMPLANT
SUT MNCRL AB 4-0 PS2 18 (SUTURE) ×3 IMPLANT
SUT PLAIN 3-0 (SUTURE) ×3 IMPLANT
SUT PROLENE 3 0 CT 1 (SUTURE) ×2 IMPLANT
SUT VIC AB 0 CT1 36 (SUTURE) ×11 IMPLANT
SUT VIC AB 2-0 CT1 36 (SUTURE) ×3 IMPLANT
SYR 30ML LL (SYRINGE) ×6 IMPLANT

## 2018-07-04 NOTE — Op Note (Signed)
Cesarean Section Procedure Note 07/04/18  Pre-operative Diagnosis: 1. Non-reassuring fetal heart tones, remote from delivery 2. Severe Preeclampsia 3. 35weeks 5 days gestation Post-operative Diagnosis: same, delivered. Procedure: Primary Low Transverse Cesarean Section Surgeon: Adelene Idler MD   Assistant(s): Wells Guiles MD - No other skilled surgical assistant available. Anesthesia: Spinal Estimated Blood Loss: 850 cc Complications: None; patient tolerated the procedure well.   Disposition: PACU - hemodynamically stable. Condition: stable   Findings: A female infant in the cephalic presentation. Amniotic fluid - clear  Birth weight: 3 lbs 11oz Apgars of 8 and 9.  Intact placenta with a three-vessel cord. Grossly normal uterus, tubes and ovaries bilaterally. No intraabdominal adhesions were noted.   Situation: Patient was taken for an urgent cesarean section due to fetal intolerance of labor. During the cesarean section when she fell asleep she desaturated to 86% and 79% SpO2 if she fell asleep. Pulmonology was consulted and she will remain on continuous pulse oximetry.    Procedure Details    The patient was taken to operating room, identified as the correct patient and the procedure verified as C-Section Delivery. A time out was held and the above information confirmed. After induction of anesthesia, the patient was draped and prepped in the usual sterile manner. A Pfannenstiel incision was made and carried down through the subcutaneous tissue to the fascia. Fascial incision was made and extended transversely with the Mayo scissors. The fascia was separated from the underlying rectus tissue superiorly and inferiorly. The peritoneum was identified and entered bluntly. Peritoneal incision was extended longitudinally. A low transverse hysterotomy was made. The fetus was delivered atraumatically. The umbilical cord was clamped x2 and cut and the infant was handed to the  awaiting pediatricians. The placenta was removed intact and appeared normal with a 3-vessel cord. The uterus was exteriorized and cleared of all clot and debris. The hysterotomy was closed with running sutures of 0 Vicryl suture. A second imbricating layer was placed with the same suture. Excellent hemostasis was observed. The uterus was returned to the abdomen. The pelvis was irrigated and again, excellent hemostasis was noted. The peritoneum was closed with a running stitch of 2-0 Vicryl. The On Q Pain pump System was then placed.  Trocars were placed through the abdominal wall into the subfascial space and these were used to thread the silver soaker cathaters into place.The rectus muscles were inspected and were hemostatic. The rectus fascia was then reapproximated with running sutures of 0-vicryl, with careful placement not to incorporate the cathaters. Subcutaneous tissues are then irrigated with saline and hemostasis assured with the bovie. The subcutaneous fat was approximated with 3-0 plain and a running stitch.  Skin was then closed with 4-0 monocryl suture in a subcuticular fashion followed by skin adhesive. The cathaters are flushed each with 5 mL of Bupivicaine and stabilized into place with dressing. Instrument, sponge, and needle counts were correct prior to the abdominal closure and at the conclusion of the case.   The patient tolerated the procedure well and was transferred to the recovery room in stable condition.   Natale Milch MD [facility], [facility] 07/04/18 6:34 PM

## 2018-07-04 NOTE — Discharge Instructions (Signed)

## 2018-07-04 NOTE — Anesthesia Preprocedure Evaluation (Signed)
Anesthesia Evaluation  Patient identified by MRN, date of birth, ID band Patient awake    Reviewed: Allergy & Precautions, H&P , NPO status , Patient's Chart, lab work & pertinent test results, reviewed documented beta blocker date and time   Airway Mallampati: II  TM Distance: >3 FB Neck ROM: full    Dental no notable dental hx. (+) Teeth Intact   Pulmonary neg pulmonary ROS, Current Smoker,    Pulmonary exam normal breath sounds clear to auscultation       Cardiovascular Exercise Tolerance: Good hypertension,  Rhythm:regular Rate:Normal     Neuro/Psych negative neurological ROS  negative psych ROS   GI/Hepatic negative GI ROS, Neg liver ROS,   Endo/Other  negative endocrine ROSdiabetes  Renal/GU      Musculoskeletal   Abdominal   Peds  Hematology negative hematology ROS (+)   Anesthesia Other Findings   Reproductive/Obstetrics (+) Pregnancy                             Anesthesia Physical Anesthesia Plan  ASA: II and emergent  Anesthesia Plan: Epidural   Post-op Pain Management:    Induction:   PONV Risk Score and Plan:   Airway Management Planned:   Additional Equipment:   Intra-op Plan:   Post-operative Plan:   Informed Consent: I have reviewed the patients History and Physical, chart, labs and discussed the procedure including the risks, benefits and alternatives for the proposed anesthesia with the patient or authorized representative who has indicated his/her understanding and acceptance.     Plan Discussed with:   Anesthesia Plan Comments:         Anesthesia Quick Evaluation

## 2018-07-04 NOTE — Transfer of Care (Signed)
Immediate Anesthesia Transfer of Care Note  Patient: Shelby Alvarez  Procedure(s) Performed: CESAREAN SECTION (N/A Abdomen)  Patient Location: PACU  Anesthesia Type:Spinal  Level of Consciousness: awake, alert  and oriented  Airway & Oxygen Therapy: Patient Spontanous Breathing  Post-op Assessment: Report given to RN and Post -op Vital signs reviewed and stable  Post vital signs: Reviewed and stable  Last Vitals:  Vitals Value Taken Time  BP 119/59 07/04/2018  6:09 PM  Temp 36.8 C 07/04/2018  6:08 PM  Pulse 100 07/04/2018  6:09 PM  Resp 15 07/04/2018  6:09 PM  SpO2 100 % 07/04/2018  6:09 PM    Last Pain:  Vitals:   07/04/18 1808  TempSrc: Oral  PainSc: 0-No pain         Complications: No apparent anesthesia complications

## 2018-07-04 NOTE — OB Triage Note (Signed)
Pt presents from ED with complaints of abdominal pain and back pain. Denies bleeding, decreased fetal movement, N/V/D or leaking of fluid.

## 2018-07-04 NOTE — Progress Notes (Signed)
L&D Progress Note: G1 P0 at 35wk5d admitted with preeclampsia with severe features based upon elevated liver enzymes after presenting with RUQ pain/ elevated blood pressures and proteinuria. Received first dose of BMZ on admission. Abdominal ultrasound remarkable for gallstones, but no evidence of hepatic hematoma. Given first dose of Cytotec around 0700.  SROM at 0950 for clear fluid. + fern with gross rupture. . Pitocin augmentation begun after SROM.   S: Contractions getting stronger.   O: Urine output: 130-290 ml/hr. Urine clear yellow. Patient Vitals for the past 24 hrs:  BP Temp Temp src Pulse Resp SpO2 Height Weight  07/04/18 1405 - - - - - 97 % - -  07/04/18 1400 - - - - - 96 % - -  07/04/18 1357 129/68 - - (!) 119 - - - -  07/04/18 1355 - - - - - 97 % - -  07/04/18 1350 - - - - - 97 % - -  07/04/18 1345 - - - - - 97 % - -  07/04/18 1340 - - - - - 98 % - -  07/04/18 1335 - - - - - 97 % - -  07/04/18 1330 - - - - - 97 % - -  07/04/18 1325 - - - - - 97 % - -  07/04/18 1320 - - - - - 96 % - -  07/04/18 1315 - - - - - 96 % - -  07/04/18 1310 - - - - - 96 % - -  07/04/18 1305 119/82 - - (!) 107 - 97 % - -  07/04/18 1300 - 97.8 F (36.6 C) - - - 98 % - -  07/04/18 1255 - - - - - 94 % - -  07/04/18 1250 - - - - - 94 % - -  07/04/18 1245 - - - - - 94 % - -  07/04/18 1240 - - - - - 94 % - -  07/04/18 1235 - - - - - 96 % - -  07/04/18 1230 - - - - - 96 % - -  07/04/18 1225 - - - - - 96 % - -  07/04/18 1220 - - - - - 97 % - -  07/04/18 1215 - - - - - 96 % - -  07/04/18 1210 - - - - - 96 % - -  07/04/18 1205 - - - - - 96 % - -  07/04/18 1200 - - - - - 96 % - -  07/04/18 1157 124/75 - - (!) 111 - - - -  07/04/18 1155 - - - - - 96 % - -  07/04/18 1150 - - - - - 96 % - -  07/04/18 1145 - - - - - 97 % - -  07/04/18 1140 - - - - - 96 % - -  07/04/18 1135 - - - - - 96 % - -  07/04/18 1130 - 98.6 F (37 C) Oral - - 96 % - -  07/04/18 1125 - - - - - 95 % - -  07/04/18 1120 - - - - -  96 % - -  07/04/18 1115 - - - - - 97 % - -  07/04/18 1110 - - - - - 97 % - -  07/04/18 1105 - - - - - 97 % - -  07/04/18 1100 - - - - - 96 % - -  07/04/18 1057 133/84 - - (!) 111 - - - -  07/04/18 1055 - - - - - 97 % - -  07/04/18 1050 - - - - - 98 % - -  07/04/18 1045 - - - - - 96 % - -  07/04/18 1040 - - - - - 98 % - -  07/04/18 1035 - - - - - 96 % - -  07/04/18 1030 - - - - - 97 % - -  07/04/18 1025 - - - - - 96 % - -  07/04/18 1020 - - - - - 97 % - -  07/04/18 1015 - - - - - 96 % - -  07/04/18 1010 - - - - - 96 % - -  07/04/18 1005 - - - - - 97 % - -  07/04/18 1000 127/81 - - (!) 110 - 97 % - -  07/04/18 0955 - - - - - 98 % - -  07/04/18 0950 - - - - - 97 % - -  07/04/18 0945 - - - - - 97 % - -  07/04/18 0940 - - - - - 97 % - -  07/04/18 0935 - - - - - 97 % - -  07/04/18 0930 - - - - - 97 % - -  07/04/18 0925 - - - - - 97 % - -  07/04/18 0920 - - - - - 99 % - -  07/04/18 0915 - - - - - 98 % - -  07/04/18 0910 - - - - - 98 % - -  07/04/18 0905 - - - - - 97 % - -  07/04/18 0857 127/74 97.7 F (36.5 C) Oral (!) 113 - - - -  07/04/18 0800 114/78 - - 96 18 - - -  07/04/18 0745 131/85 - - (!) 109 - - - -  07/04/18 0730 133/77 - - (!) 117 - - - -  07/04/18 0656 133/83 - - (!) 103 - - - -  07/04/18 0641 (!) 145/86 - - (!) 101 - - - -  07/04/18 0626 (!) 149/90 - - 95 - - 4\' 11"  (1.499 m) 83.5 kg  07/04/18 0603 129/63 - - (!) 101 - - - -  07/04/18 0548 138/65 - - 88 - - - -  07/04/18 0547 138/65 - - 88 - - - -  07/04/18 0533 130/83 - - 86 - - - -  07/04/18 0518 106/65 - - 81 - - - -  07/04/18 0510 123/60 - - 85 - - - -  07/04/18 0448 136/80 - - (!) 104 - - - -  07/04/18 0432 135/89 - - 91 - - - -  07/04/18 0418 135/87 - - 90 - - - -  07/04/18 0403 129/72 - - 87 - - - -  07/04/18 0348 (!) 122/98 - - 86 - - - -  07/04/18 0343 134/89 - - 80 - - - -  07/04/18 0318 (!) 136/94 - - 91 - - - -  07/04/18 0317 (!) 136/94 98.4 F (36.9 C) Oral 93 20 - - -  07/04/18 0316 (!) 139/91  - - 79 - - - -  07/04/18 0314 (!) 146/101 - - 81 - - - -  General: breathing through contractions Heart: mild tachycardia with regular rhythm, no murmurs appreciated Lungs: CTAB/ normal respiratory effort DTR's: +1 FHR: 135 with repetitive early decelerations prior to exam, moderate variability. Toco: contractions every 3-4 minutes on 5 miu/min Pitocin Cervix: 3/80%/-1: AROM forebag. After  AROM of forebag IUPC placed. There were then moderate to severe variables that resolved with position change.    Results for orders placed or performed during the hospital encounter of 07/04/18 (from the past 24 hour(s))  Urine Drug Screen, Qualitative ([facility] only)     Status: None   Collection Time: 07/04/18  3:43 AM  Result Value Ref Range   Tricyclic, Ur Screen NONE DETECTED NONE DETECTED   Amphetamines, Ur Screen NONE DETECTED NONE DETECTED   MDMA (Ecstasy)Ur Screen NONE DETECTED NONE DETECTED   Cocaine Metabolite,Ur [place] NONE DETECTED NONE DETECTED   Opiate, Ur Screen NONE DETECTED NONE DETECTED   Phencyclidine (PCP) Ur S NONE DETECTED NONE DETECTED   Cannabinoid 50 Ng, Ur [place] NONE DETECTED NONE DETECTED   Barbiturates, Ur Screen NONE DETECTED NONE DETECTED   Benzodiazepine, Ur Scrn NONE DETECTED NONE DETECTED   Methadone Scn, Ur NONE DETECTED NONE DETECTED  Protein / creatinine ratio, urine     Status: Abnormal   Collection Time: 07/04/18  3:43 AM  Result Value Ref Range   Creatinine, Urine 111 mg/dL   Total Protein, Urine 69 mg/dL   Protein Creatinine Ratio 0.62 (H) 0.00 - 0.15 mg/mg[Cre]  Comprehensive metabolic panel     Status: Abnormal   Collection Time: 07/04/18  4:58 AM  Result Value Ref Range   Sodium 135 135 - 145 mmol/L   Potassium 3.7 3.5 - 5.1 mmol/L   Chloride 108 98 - 111 mmol/L   CO2 19 (L) 22 - 32 mmol/L   Glucose, Bld 98 70 - 99 mg/dL   BUN 10 6 - 20 mg/dL   Creatinine, Ser 1.61 0.44 - 1.00 mg/dL   Calcium 8.7 (L) 8.9 - 10.3 mg/dL   Total Protein 6.2 (L) 6.5 - 8.1 g/dL    Albumin 3.1 (L) 3.5 - 5.0 g/dL   AST 096 (H) 15 - 41 U/L   ALT 95 (H) 0 - 44 U/L   Alkaline Phosphatase 130 (H) 38 - 126 U/L   Total Bilirubin 0.9 0.3 - 1.2 mg/dL   GFR calc non Af Amer >60 >60 mL/min   GFR calc Af Amer >60 >60 mL/min   Anion gap 8 5 - 15  CBC     Status: Abnormal   Collection Time: 07/04/18  4:58 AM  Result Value Ref Range   WBC 17.3 (H) 4.0 - 10.5 K/uL   RBC 4.59 3.87 - 5.11 MIL/uL   Hemoglobin 13.8 12.0 - 15.0 g/dL   HCT 04.5 40.9 - 81.1 %   MCV 87.4 80.0 - 100.0 fL   MCH 30.1 26.0 - 34.0 pg   MCHC 34.4 30.0 - 36.0 g/dL   RDW 91.4 78.2 - 95.6 %   Platelets 171 150 - 400 K/uL   nRBC 0.0 0.0 - 0.2 %  Hepatitis B surface antigen     Status: None   Collection Time: 07/04/18  6:21 AM  Result Value Ref Range   Hepatitis B Surface Ag Negative Negative  CBC     Status: Abnormal   Collection Time: 07/04/18  7:08 AM  Result Value Ref Range   WBC 17.7 (H) 4.0 - 10.5 K/uL   RBC 4.44 3.87 - 5.11 MIL/uL   Hemoglobin 13.5 12.0 - 15.0 g/dL   HCT 21.3 08.6 - 57.8 %   MCV 86.5 80.0 - 100.0 fL   MCH 30.4 26.0 - 34.0 pg   MCHC 35.2 30.0 - 36.0 g/dL   RDW 46.9 62.9 - 52.8 %  Platelets 165 150 - 400 K/uL   nRBC 0.0 0.0 - 0.2 %  Type and screen [facility]     Status: None   Collection Time: 07/04/18  7:08 AM  Result Value Ref Range   ABO/RH(D) B POS    Antibody Screen NEG    Sample Expiration      07/07/2018 Performed at [facility], [address] [place], [place] [id number]   Chlamydia/NGC rt PCR [facility] only)     Status: None   Collection Time: 07/04/18  7:46 AM  Result Value Ref Range   Specimen source GC/Chlam ENDOCERVICAL    Chlamydia Tr NOT DETECTED NOT DETECTED   N gonorrhoeae NOT DETECTED NOT DETECTED  Group B strep by PCR     Status: None   Collection Time: 07/04/18  7:46 AM  Result Value Ref Range   Group B strep by PCR NEGATIVE NEGATIVE  Comprehensive metabolic panel     Status: Abnormal   Collection Time: 07/04/18   9:54 AM  Result Value Ref Range   Sodium 133 (L) 135 - 145 mmol/L   Potassium 3.9 3.5 - 5.1 mmol/L   Chloride 106 98 - 111 mmol/L   CO2 17 (L) 22 - 32 mmol/L   Glucose, Bld 109 (H) 70 - 99 mg/dL   BUN 8 6 - 20 mg/dL   Creatinine, Ser 6.040.52 0.44 - 1.00 mg/dL   Calcium 8.9 8.9 - 54.010.3 mg/dL   Total Protein 6.2 (L) 6.5 - 8.1 g/dL   Albumin 3.0 (L) 3.5 - 5.0 g/dL   AST 981146 (H) 15 - 41 U/L   ALT 110 (H) 0 - 44 U/L   Alkaline Phosphatase 126 38 - 126 U/L   Total Bilirubin 1.1 0.3 - 1.2 mg/dL   GFR calc non Af Amer >60 >60 mL/min   GFR calc Af Amer >60 >60 mL/min   Anion gap 10 5 - 15  CBC     Status: Abnormal   Collection Time: 07/04/18  9:54 AM  Result Value Ref Range   WBC 16.4 (H) 4.0 - 10.5 K/uL   RBC 4.45 3.87 - 5.11 MIL/uL   Hemoglobin 13.5 12.0 - 15.0 g/dL   HCT 19.138.6 47.836.0 - 29.546.0 %   MCV 86.7 80.0 - 100.0 fL   MCH 30.3 26.0 - 34.0 pg   MCHC 35.0 30.0 - 36.0 g/dL   RDW 62.113.7 30.811.5 - 65.715.5 %   Platelets 162 150 - 400 K/uL   nRBC 0.0 0.0 - 0.2 %   [place] Abdomen Limited Ruq  Result Date: 07/04/2018 CLINICAL DATA:  Right upper quadrant pain. EXAM: ULTRASOUND ABDOMEN LIMITED RIGHT UPPER QUADRANT COMPARISON:  Ultrasound 01/25/2018. FINDINGS: Gallbladder: Gallstone and small amount sludge noted. Gallbladder wall thickness 1.6 mm. Negative Murphy sign. Common bile duct: Diameter: 2.1 mm Liver: No focal lesion identified. Within normal limits in parenchymal echogenicity. Portal vein is patent on color Doppler imaging with normal direction of blood flow towards the liver. IMPRESSION: Gallstone and small amount of sludge noted. No evidence of cholecystitis or biliary distention. Electronically Signed   By: Maisie Fushomas  Register   On: 07/04/2018 07:48   A/P: IUP at 35 wk5d with preeclampsia with severe features:   Blood pressures in the mild to normal range  Slight rise of liver enzymes with AST rising from 129to 146 and ALT rising from 95 to  110. Platelets remain normal. No evidence of hepatic  hematoma  Continue magnesium sulfate. Repeat CBC at 1600  FWB: Variable decelerations: Amnioinfusion begun and at this time the variables have stopped. Continue amnioinfusion at 50 ml/hrContinue Pitocin augmentation. Titer Pitocin to 200 mvus. Continuous monitoring. Give second dose of BMZ if not delivered GBS negative-PPX not indicated.   Farrel Conners, CNM

## 2018-07-04 NOTE — H&P (Signed)
Obstetric H&P   Chief Complaint: Abdominal pain  Prenatal Care Provider: [facility]  History of Present Illness: 23 y.o. G1P0 55w5dby 08/03/2018, by 10 week Ultrasound presenting to L&D with RUQ/epigastric abdominal pain.  This started yesterday evening.  She took a shower and laid down with initial resolution/improvement with pain then coming back.  Exacerbating factors include standing.  She has not noted any nausea or emesis.  She denies headaches or vision changes.  She was diagnosed with proteinuria on P/C ration triggered by proteinuria in clinic (normotensive thus far this pregnancy)  She reports +FM, no LOF, no VB, irregular contractions.    Pregravid weight 70.3 kg Total Weight Gain 10.9 kg  Pregnancy#1 Problems (from 10/12/17 to present)    Problem Noted Resolved   Severe preeclampsia, third trimester 07/04/2018 by SMalachy Mood MD No   Overview Signed 07/04/2018  6:31 AM by SMalachy Mood MD    Based on lab criteria with liver enzymes twice upper limit of normal 07/04/2017      Supervision of normal first pregnancy, antepartum 01/13/2018 by SMalachy Mood MD No   Overview Addendum 07/04/2018  6:30 AM by SMalachy Mood MD    Clinic [facility] Prenatal Labs  Dating 10 week UKoreaBlood type: B positive  Genetic Screen NIPS: negative XX SMA: neg FragileX: neg CF: neg Antibody:negative  Anatomic UKoreaNormal Female Rubella: Immune Varicella: Immune  GTT 117 RPR: NR  Rhogam N/A HBsAg: negative  TDaP vaccine 01/13/18  Flu Shot: HIV: negative  Baby Food                                GBS: Uknown  Contraception  GBS prior pregnancy: N/A  Pelvis Tested  N/A Pap: N/A <20  CS/VBAC  HgbAA  Support Person               Review of Systems: 10 point review of systems negative unless otherwise noted in HPI  Past Medical History: Past Medical History:  Diagnosis Date  . No known health problems   . Pregnancy of unknown anatomic location 12/02/2017    Past Surgical  History: Past Surgical History:  Procedure Laterality Date  . ANKLE SURGERY    . BUNIONECTOMY      Past Obstetric History: #: 1, Date: None, Sex: None, Weight: None, GA: None, Delivery: None, Apgar1: None, Apgar5: None, Living: None, Birth Comments: None  Family History: Family History  Problem Relation Age of Onset  . Cancer Neg Hx   . Diabetes Neg Hx   . Hypertension Neg Hx   . Stroke Neg Hx   . Thyroid disease Neg Hx     Social History: Social History   Socioeconomic History  . Marital status: Married    Spouse name: Not on file  . Number of children: Not on file  . Years of education: Not on file  . Highest education level: Not on file  Occupational History  . Not on file  Social Needs  . Financial resource strain: Not on file  . Food insecurity:    Worry: Not on file    Inability: Not on file  . Transportation needs:    Medical: Not on file    Non-medical: Not on file  Tobacco Use  . Smoking status: Never Smoker  . Smokeless tobacco: Never Used  Substance and Sexual Activity  . Alcohol use: No    Alcohol/week: 0.0 standard drinks  .  Drug use: Never  . Sexual activity: Yes    Birth control/protection: None  Lifestyle  . Physical activity:    Days per week: 0 days    Minutes per session: 0 min  . Stress: Not on file  Relationships  . Social connections:    Talks on phone: Not on file    Gets together: Not on file    Attends religious service: Not on file    Active member of club or organization: Not on file    Attends meetings of clubs or organizations: Not on file    Relationship status: Not on file  . Intimate partner violence:    Fear of current or ex partner: Not on file    Emotionally abused: Not on file    Physically abused: Not on file    Forced sexual activity: Not on file  Other Topics Concern  . Not on file  Social History Narrative  . Not on file    Medications: Prior to Admission medications   Medication Sig Start Date End Date  Taking? Authorizing Provider  prenatal vitamin w/FE, FA (PRENATAL 1 + 1) 27-1 MG TABS tablet Take 1 tablet by mouth daily at 12 noon.    [provider]    Allergies: No Known Allergies  Physical Exam: Temp:  [98.4 F (36.9 C)] 98.4 F (36.9 C) (01/10 0317) Pulse Rate:  [79-104] 95 (01/10 0626) Resp:  [20] 20 (01/10 0317) BP: (106-149)/(60-101) 149/90 (01/10 0626)   Urine Dip Protein: see P/C ratio  FHT: 150, moderate, +accels, no decels Toco: q5-30mn irregular  General: NAD HEENT: normocephalic, anicteric Pulmonary: No increased work of breathing Cardiovascular: RRR, distal pulses 2+ Abdomen: Gravid, non-tender Leopolds: vtx Genitourinary: closed, soft, high Extremities: no edema, erythema, or tenderness Neurologic: Grossly intact Psychiatric: mood appropriate, affect full  Labs: Results for orders placed or performed during the hospital encounter of 07/04/18 (from the past 24 hour(s))  Urine Drug Screen, Qualitative ([facility] only)     Status: None   Collection Time: 07/04/18  3:43 AM  Result Value Ref Range   Tricyclic, Ur Screen NONE DETECTED NONE DETECTED   Amphetamines, Ur Screen NONE DETECTED NONE DETECTED   MDMA (Ecstasy)Ur Screen NONE DETECTED NONE DETECTED   Cocaine Metabolite,Ur [place] NONE DETECTED NONE DETECTED   Opiate, Ur Screen NONE DETECTED NONE DETECTED   Phencyclidine (PCP) Ur S NONE DETECTED NONE DETECTED   Cannabinoid 50 Ng, Ur [place] NONE DETECTED NONE DETECTED   Barbiturates, Ur Screen NONE DETECTED NONE DETECTED   Benzodiazepine, Ur Scrn NONE DETECTED NONE DETECTED   Methadone Scn, Ur NONE DETECTED NONE DETECTED  Protein / creatinine ratio, urine     Status: Abnormal   Collection Time: 07/04/18  3:43 AM  Result Value Ref Range   Creatinine, Urine 111 mg/dL   Total Protein, Urine 69 mg/dL   Protein Creatinine Ratio 0.62 (H) 0.00 - 0.15 mg/mg[Cre]  Comprehensive metabolic panel     Status: Abnormal   Collection Time: 07/04/18  4:58 AM  Result  Value Ref Range   Sodium 135 135 - 145 mmol/L   Potassium 3.7 3.5 - 5.1 mmol/L   Chloride 108 98 - 111 mmol/L   CO2 19 (L) 22 - 32 mmol/L   Glucose, Bld 98 70 - 99 mg/dL   BUN 10 6 - 20 mg/dL   Creatinine, Ser 0.50 0.44 - 1.00 mg/dL   Calcium 8.7 (L) 8.9 - 10.3 mg/dL   Total Protein 6.2 (L) 6.5 - 8.1  g/dL   Albumin 3.1 (L) 3.5 - 5.0 g/dL   AST 129 (H) 15 - 41 U/L   ALT 95 (H) 0 - 44 U/L   Alkaline Phosphatase 130 (H) 38 - 126 U/L   Total Bilirubin 0.9 0.3 - 1.2 mg/dL   GFR calc non Af Amer >60 >60 mL/min   GFR calc Af Amer >60 >60 mL/min   Anion gap 8 5 - 15  CBC     Status: Abnormal   Collection Time: 07/04/18  4:58 AM  Result Value Ref Range   WBC 17.3 (H) 4.0 - 10.5 K/uL   RBC 4.59 3.87 - 5.11 MIL/uL   Hemoglobin 13.8 12.0 - 15.0 g/dL   HCT 40.1 36.0 - 46.0 %   MCV 87.4 80.0 - 100.0 fL   MCH 30.1 26.0 - 34.0 pg   MCHC 34.4 30.0 - 36.0 g/dL   RDW 13.7 11.5 - 15.5 %   Platelets 171 150 - 400 K/uL   nRBC 0.0 0.0 - 0.2 %    Assessment: 23 y.o. G1P0 11w5dby 08/03/2018, by Ultrasound presenting with RUQ/epigastric pain ruled in for preeclampsia based on mild range BP and elevation in liver enzymes  Plan: 1) Preeclampsia with severe features - mild range blood pressures on admission, in the setting of previously diagnosed proteinuria.  Severe criteria met by twice upper limit of normal elevation of AST and ALT.  Given RUQ pain will also obtain bedside RUQ ultrasound to rule out capsular hematoma. - HepB and C sent - RUQ UKorea- magnesium sulfate - Cervidil 252m - repeat labs at 10:00 - NPO - Start BMZ course  2) Fetus - cat I tracing  3) PNL - Blood type B/Positive/-- (07/22 1126) / Anti-bodyscreen Negative (07/22 1126) / Rubella 6.16 (07/22 1126) / Varicella Immune / RPR Non Reactive (11/13 0919) / HBsAg Negative (07/22 1126) / HIV Non Reactive (11/13 0919) / 1-hr OGTT 117 / GBS unknown - GBS PCR sent - anticipate ample time to obtain GBS results prior to delivery to  initiate GBS ppx if indicated, if starts progressing quicker than expected start ppx for GBS unknown at less than 37 weeks  4) Disposition -  Pending delivery  AnMalachy MoodMD, [facility] 07/04/2018, 6:34 AM

## 2018-07-04 NOTE — Progress Notes (Signed)
L&D Progress Note    Started back having repetitive severe variable decelerations despite Pitocin being discontinued.  O2 applied, position changes, and placed in Trendelenberg. Terbutaline given.  FHR:  Cervix: 3.5/80%/-1 to -2  A: FITL  P: Consulted Dr Jerene Pitch for Cesarean section Patient prepared for Cesarean Explained reason for operative delivery, being remote from vaginal delivery with severe variable decelerations. Aware of risks of bleeding, infection, and risks of injury to other abdominal organs, and risks of anesthesia. Consent signed.  Farrel Conners, CNM

## 2018-07-04 NOTE — Anesthesia Procedure Notes (Signed)
Spinal  Patient location during procedure: OB Staffing Performed: anesthesiologist  Preanesthetic Checklist Completed: patient identified, site marked, surgical consent, pre-op evaluation, timeout performed, IV checked, risks and benefits discussed and monitors and equipment checked Spinal Block Patient position: sitting Prep: Betadine Patient monitoring: heart rate, continuous pulse ox, blood pressure and cardiac monitor Approach: midline Location: L4-5 Injection technique: single-shot Needle Needle type: Whitacre and Introducer  Needle gauge: 25 G Needle length: 10 cm Assessment Sensory level: T6 Additional Notes Negative paresthesia. Negative blood return. Positive free-flowing CSF. Expiration date of kit checked and confirmed. Patient tolerated procedure well, without complications.

## 2018-07-04 NOTE — Anesthesia Post-op Follow-up Note (Signed)
Anesthesia QCDR form completed.        

## 2018-07-05 LAB — CBC
HCT: 34 % — ABNORMAL LOW (ref 36.0–46.0)
Hemoglobin: 11.4 g/dL — ABNORMAL LOW (ref 12.0–15.0)
MCH: 30.1 pg (ref 26.0–34.0)
MCHC: 33.5 g/dL (ref 30.0–36.0)
MCV: 89.7 fL (ref 80.0–100.0)
Platelets: 149 10*3/uL — ABNORMAL LOW (ref 150–400)
RBC: 3.79 MIL/uL — ABNORMAL LOW (ref 3.87–5.11)
RDW: 14 % (ref 11.5–15.5)
WBC: 15.4 10*3/uL — ABNORMAL HIGH (ref 4.0–10.5)
nRBC: 0 % (ref 0.0–0.2)

## 2018-07-05 LAB — HEPATITIS C ANTIBODY: HCV Ab: <0.1 s/co ratio (ref 0.0–0.9)

## 2018-07-05 LAB — RPR: RPR Ser Ql: NONREACTIVE

## 2018-07-05 NOTE — Discharge Summary (Signed)
OB Discharge Summary     Patient Name: Shelby Alvarez DOB: October 08, 1995 MRN: [id number]  Date of admission: 07/04/2018 Delivering MD: Natale Milchhristanna R Schuman, MD  Date of Delivery: 07/04/2018  Date of discharge: 07/06/2018  Admitting diagnosis: Abdominal pain Intrauterine pregnancy: 2750w5d     Secondary diagnosis: Severe Preeclampsia     Discharge diagnosis: Preterm Pregnancy Delivered, Preeclampsia (severe) and Reasons for cesarean section  [facility]                         Hospital course:  Induction of Labor With Cesarean Section  23 y.o. yo G1P0101 at 5250w5d was admitted to the hospital 07/04/2018 for induction of labor. Patient had a labor course significant for progressed to 3cm then needed a cesarean section for fetal intolerance of labor. The patient went for cesarean section due to Non-Reassuring FHR, and delivered a Viable infant,07/04/2018  Membrane Rupture Time/Date: 9:50 AM ,07/04/2018   Details of operation can be found in separate operative Note.  Patient had 24 hours IV magnesium sulfate postpartum . She progressed well over the next 24 hours and she  is ambulating, tolerating a regular diet, passing flatus, and urinating well.  Patient is discharged home in stable condition on 07/06/18.                                                                                                   Post partum procedures:postpartum IV magnesium sulfate  Complications: None  Physical exam on 07/06/2018: Vitals:   07/05/18 2327 07/06/18 0407 07/06/18 0831 07/06/18 1243  BP: 123/80 120/72 114/81 127/82  Pulse: (!) 108 95 87 (!) 101  Resp: 18 18 20 18   Temp: 98.1 F (36.7 C) 98.2 F (36.8 C) 98.7 F (37.1 C) 98.5 F (36.9 C)  TempSrc: Oral Oral Oral Oral  SpO2: 96% 97% 98% 97%  Weight:      Height:       General: alert, cooperative and no distress Lochia: appropriate Uterine Fundus: firm Incision: Healing well with no significant drainage, On Q pump intact DVT Evaluation: No  evidence of DVT seen on physical exam.  Labs: Lab Results  Component Value Date   WBC 15.4 (H) 07/05/2018   HGB 11.4 (L) 07/05/2018   HCT 34.0 (L) 07/05/2018   MCV 89.7 07/05/2018   PLT 149 (L) 07/05/2018   CMP Latest Ref Rng & Units 07/04/2018  Glucose 70 - 99 mg/dL 132(G109(H)  BUN 6 - 20 mg/dL 8  Creatinine 4.010.44 - 0.271.00 mg/dL 2.530.52  Sodium 664135 - 403145 mmol/L 133(L)  Potassium 3.5 - 5.1 mmol/L 3.9  Chloride 98 - 111 mmol/L 106  CO2 22 - 32 mmol/L 17(L)  Calcium 8.9 - 10.3 mg/dL 8.9  Total Protein 6.5 - 8.1 g/dL 6.2(L)  Total Bilirubin 0.3 - 1.2 mg/dL 1.1  Alkaline Phos 38 - 126 U/L 126  AST 15 - 41 U/L 146(H)  ALT 0 - 44 U/L 110(H)    Discharge instruction: per After Visit Summary.  Medications:  Allergies as of 07/06/2018   No Known Allergies  Medication List    TAKE these medications   HYDROcodone-acetaminophen 5-325 MG tablet Commonly known as:  NORCO/VICODIN Take 1 tablet by mouth every 6 (six) hours as needed for up to 5 days for moderate pain or severe pain.   prenatal vitamin w/FE, FA 27-1 MG Tabs tablet Take 1 tablet by mouth daily at 12 noon.            Discharge Care Instructions  (From admission, onward)         Start     Ordered   07/06/18 0000  Discharge wound care:    Comments:  Keep incision dry, clean.   07/06/18 1337          Diet: routine diet  Activity: Advance as tolerated. Pelvic rest for 6 weeks.   Outpatient follow up: Follow-up Information    Schuman, Jaquelyn Bitter, MD. Schedule an appointment as soon as possible for a visit in 1 week.   Specialty:  Obstetrics and Gynecology Why:  Post op check Contact information: [address] [place] [address] [phone]             Postpartum contraception: Nexplanon Rhogam Given postpartum: NA Rubella vaccine given postpartum: Rubella Immune  Varicella vaccine given postpartum: Varicella Immune TDaP given antepartum or postpartum: Yes  Newborn Data: Live born  female [name] Birth Weight: 3 lb 11.6 oz (1690 g) APGAR: 8, 9  Newborn Delivery   Birth date/time:  07/04/2018 16:58:00 Delivery type:  C-Section, Low Transverse Trial of labor:  Yes C-section categorization:  Primary      Baby Feeding: Donor breast milk   Disposition:NICU  SIGNED:  Tresea Mall, CNM 07/06/2018 1:38 PM

## 2018-07-05 NOTE — Progress Notes (Signed)
Spoke to Pulmonologist Dr Park Breed and Tresea Mall regarding pulmonology consult. Since patient has been stable with no oxygen saturations below 90 % since surgery it is ok to post pone consult at this time. Will call Dr Park Breed if further input is needed.

## 2018-07-05 NOTE — Lactation Note (Signed)
This note was copied from a baby's chart. Lactation Consultation Note  Patient Name: Shelby Alvarez Today's Date: 07/05/2018   Mom already has Symphony set up in room and Dahlia Client (her nurse in [place]) has already pumped her twice.  Applied warm compresses, massaged breasts and hand expressed (beading on tip of nipple only for now).  Assisted mom with pumping bilaterally.  Mom has large nipples.  Increased flange size to #27 which mom says feels better.  Reviewed pumping, collection, storage, labeling and handling of colostrum/breast milk.  Mom encouraged to send any colostrum, even drops, to [facility]N for [place].  Encouraged and praised mom for her commitment to pump every 3 hours for her baby in [facility].  Encouraged mom to call lactation for any questions, concerns or assistance.  Maternal Data    Feeding Feeding Type: Donor Breast Milk Nipple Type: Slow - flow  LATCH Score                   Interventions    Lactation Tools Discussed/Used     Consult Status      [name] 07/05/2018, 6:06 PM

## 2018-07-05 NOTE — Progress Notes (Signed)
Magnesium Check Note  07/05/2018 - 10:48 AM  23 y.o. G1P0101.  Pregnancy complicated by preeclampsia with severe features.  Patient Active Problem List   Diagnosis Date Noted  . Abdominal pain during pregnancy, third trimester 07/04/2018  . Severe preeclampsia, third trimester 07/04/2018  . Proteinuria affecting pregnancy in third trimester 07/02/2018  . Second trimester bleeding 02/10/2018  . Supervision of normal first pregnancy, antepartum 01/13/2018      Subjective:  Patient denies headache, denies visual disturbances, denies RUQ pain, denies chest pain, denies shortness of breath.  She has been tolerating PO intake and her pain is well controlled.  Objective:   Vitals:   07/05/18 0745 07/05/18 0750 07/05/18 0755 07/05/18 0800  BP:      Pulse:      Resp:      Temp:      TempSrc:      SpO2: 99% 99% 99% 99%  Weight:      Height:        Current Vital Signs 24h Vital Sign Ranges  T 98.1 F (36.7 C) Temp  Avg: 98.2 F (36.8 C)  Min: 97.8 F (36.6 C)  Max: 98.7 F (37.1 C)  BP 112/69 BP  Min: 108/61  Max: 134/83  HR 91 Pulse  Avg: 99  Min: 85  Max: 119  RR 18 Resp  Avg: 16.7  Min: 13  Max: 22  SaO2 99 % Room Air SpO2  Avg: 97.6 %  Min: 94 %  Max: 100 %       24 Hour I/O Current Shift I/O  Time Ins Outs 01/10 0701 - 01/11 0700 In: 2847.1 [I.V.:2847.1] Out: 4807 [Urine:3545] 01/11 0701 - 01/11 1900 In: -  Out: 350 [Urine:350]   Physical Exam: General: NAD Pulm: CTAB CV: RRR Abd: Soft, nontender, nondistended, +BS Incision: C/D/I, On Q pump intact Ext: SCDs in place Neuro: DTRs 2+ brachioradialis  Labs:  Recent Labs  Lab 07/04/18 0954 07/04/18 1554 07/05/18 0610  WBC 16.4* 15.0* 15.4*  HGB 13.5 13.6 11.4*  HCT 38.6 39.7 34.0*  PLT 162 170 149*   Recent Labs  Lab 07/02/18 1425 07/04/18 0458 07/04/18 0954  NA 138 135 133*  K 4.6 3.7 3.9  CL 106 108 106  CO2 19* 19* 17*  BUN 11 10 8   CREATININE 0.58 0.50 0.52  CALCIUM 9.3 8.7* 8.9  PROT 5.7*  6.2* 6.2*  BILITOT 0.6 0.9 1.1  ALKPHOS 124* 130* 126  ALT 10 95* 110*  AST 15 129* 146*  GLUCOSE 99 98 109*    Medications SCHEDULED MEDICATIONS  . acetaminophen  1,000 mg Oral Q6H  . ferrous sulfate  325 mg Oral BID WC  . ibuprofen  800 mg Oral Q8H  . prenatal multivitamin  1 tablet Oral Q1200  . senna-docusate  2 tablet Oral Q24H  . simethicone  80 mg Oral TID PC  . simethicone  80 mg Oral Q24H    MEDICATION INFUSIONS  . bupivacaine 0.25 % ON-Q pump DUAL CATH 400 mL    . bupivacaine ON-Q pain pump    . lactated ringers 37.5 mL/hr at 07/04/18 1930  . magnesium sulfate 2 g/hr (07/05/18 0140)    PRN MEDICATIONS  bisacodyl, coconut oil, witch hazel-glycerin **AND** dibucaine, diphenhydrAMINE, fentaNYL (SUBLIMAZE) injection, labetalol **AND** labetalol **AND** labetalol **AND** hydrALAZINE **AND** Measure blood pressure, HYDROcodone-acetaminophen, HYDROmorphone (DILAUDID) injection, loperamide, menthol-cetylpyridinium, ondansetron (ZOFRAN) IV, simethicone, sodium phosphate   Assessment & Plan:  23 y.o. G1P0101 without signs of magnesium toxicity  1) preeclampsia with severe feature, postpartum: continue magnesum 2) plan for discontinuation of magnesium 24 hours from time of delivery. 3) Continue routine post c/section care   Tresea Mall, CNM 07/05/2018 10:48 AM  [facility] [place]

## 2018-07-06 MED ORDER — ACETAMINOPHEN 500 MG PO TABS
1000.0000 mg | ORAL_TABLET | Freq: Four times a day (QID) | ORAL | Status: DC
  Administered 2018-07-06 (×2): 1000 mg via ORAL
  Filled 2018-07-06 (×2): qty 2

## 2018-07-06 MED ORDER — HYDROCODONE-ACETAMINOPHEN 5-325 MG PO TABS: 1.0000 | ORAL_TABLET | Freq: Four times a day (QID) | ORAL | 0 refills | Status: AC | PRN

## 2018-07-06 NOTE — Progress Notes (Signed)
Discharge instructions complete and prescriptions given. Patient verbalizes understanding of teaching. Patient discharged home via wheelchair at 1545.

## 2018-07-06 NOTE — Anesthesia Postprocedure Evaluation (Signed)
Anesthesia Post Note  Patient: Shelby Alvarez  Procedure(s) Performed: CESAREAN SECTION (N/A Abdomen)  Patient location during evaluation: Mother Baby Anesthesia Type: Spinal Level of consciousness: awake and alert Pain management: pain level controlled Vital Signs Assessment: post-procedure vital signs reviewed and stable Respiratory status: spontaneous breathing Cardiovascular status: stable Postop Assessment: no headache, no backache, able to ambulate and adequate PO intake Anesthetic complications: no     Last Vitals:  Vitals:   07/06/18 0407 07/06/18 0831  BP: 120/72 114/81  Pulse: 95 87  Resp: 18 20  Temp: 36.8 C 37.1 C  SpO2: 97% 98%    Last Pain:  Vitals:   07/06/18 0845  TempSrc:   PainSc: 5                  Zachary GeorgeWeatherly,  Ladeana Laplant F

## 2018-07-06 NOTE — Progress Notes (Signed)
  Subjective:   Post Op Day 2: c/section: Patient is doing well. She is ambulating and voiding without difficulty. She is tolerating PO intake and her pain is well controlled with PO pain medication. She reports pumping is not going well and she will have consult with lactation today. She wonders how long baby will stay in hospital. Explained unknown, but dependent on baby's progress with feeding, breathing, temperature regulation, etc and that pediatricians will be by to see her this morning.   Objective:  Blood pressure 114/81, pulse 87, temperature 98.7 F (37.1 C), temperature source Oral, resp. rate 20, height 4\' 11"  (1.499 m), weight 83.5 kg, last menstrual period 10/12/2017, SpO2 98 %, pumping to stimulate milk production  General: NAD Pulmonary: no increased work of breathing Abdomen: non-distended, non-tender, fundus firm at level of umbilicus Incision: C/D/I, On Q pump intact Extremities: no edema, no erythema, no tenderness    Assessment:   23 y.o. G1P0101 postoperativeday # 2 c/section   Plan:  1) Acute blood loss anemia - hemodynamically stable and asymptomatic - po ferrous sulfate  2) B positive, Rubella Immune, Varicella Immune  3) TDAP status: given antepartum   4) Breast/Bottle/Contraception: Nexplanon  5) Disposition: continue routine post c/section care   Tresea MallJane Karsin Pesta, CNM

## 2018-07-06 NOTE — Plan of Care (Signed)
Vs stable; up ab lib (either husband assist to bathroom or pt calls for assistance); taking motrin and tylenol for pain control; using breast pump; baby in [facility] (did go once this shift with significant other to see baby)

## 2018-07-07 ENCOUNTER — Ambulatory Visit: Payer: Self-pay

## 2018-07-07 ENCOUNTER — Encounter: Payer: Self-pay | Admitting: Obstetrics and Gynecology

## 2018-07-07 NOTE — Lactation Note (Signed)
This note was copied from a baby's chart. Lactation Consultation Note  Patient Name: Shelby Alvarez Today's Date: 07/07/2018   Mom has not been in to visit with [name] today.  LC had discussed with mom yesterday before she was discharged about continuing to pump to supply breast milk for her baby.  She reports having a Medela DEBP at home that she had purchased and was planning to use after discharge.  Called mom at home today.  She says she just pumped and that her Arta Bruce is working just fine.  Mom reports finally starting to get some milk now and plans to bring to baby in about 20 minutes.  Told mom to have them call lactation when she gets her with any questions, concerns or in need of assistance.   Maternal Data    Feeding Feeding Type: Bottle Fed - Breast Milk Nipple Type: Slow - flow  LATCH Score                   Interventions    Lactation Tools Discussed/Used Tools: Bottle   Consult Status      [name] 07/07/2018, 3:28 PM

## 2018-07-08 LAB — SURGICAL PATHOLOGY

## 2018-07-11 ENCOUNTER — Ambulatory Visit: Payer: Self-pay

## 2018-07-11 NOTE — Lactation Note (Signed)
This note was copied from a baby's chart. Lactation Consultation Note  Patient Name: Girl Brynda Dewater [id number] Date: 07/11/2018     Maternal Data  mom pumps approx. 3-4  Oz EBM with Medela PUmp and style every 4 hrs, encouraged to pump ever 3 hrs during day and maybe once during night if she awakens, may pump before attempting nursing session to control let down and flow when baby first latches    Feeding  Baby was nursing on 24 mm nipple shield when I saw the couplet, baby very sleepy and sucked occ., came off breast when nipple shield was full of breast milk and milk was dropping down cheek, nursed approx 10 min of occ. Sucking., mom encouraged that this was a good first feed and that it is normal for baby to tire easily at first feed. LATCH Score                   Interventions    Lactation Tools Discussed/Used     Consult Status      Dyann Kief 07/11/2018, 4:15 PM

## 2018-07-14 ENCOUNTER — Encounter: Payer: Self-pay | Admitting: Obstetrics and Gynecology

## 2018-07-14 ENCOUNTER — Telehealth: Payer: Self-pay | Admitting: Obstetrics and Gynecology

## 2018-07-14 ENCOUNTER — Ambulatory Visit (INDEPENDENT_AMBULATORY_CARE_PROVIDER_SITE_OTHER): Payer: Medicaid Other | Admitting: Obstetrics and Gynecology

## 2018-07-14 DIAGNOSIS — Z98891 History of uterine scar from previous surgery: Secondary | ICD-10-CM

## 2018-07-14 NOTE — Progress Notes (Signed)
  OBSTETRICS POSTPARTUM CLINIC PROGRESS NOTE  Subjective:     Shelby Alvarez is a 23 y.o. G70P0101 female who presents for a postpartum visit. She is 1 week postpartum following a Preterm pregnancy <37 weeks and delivery by C-section emergent.  I have fully reviewed the prenatal and intrapartum course. Anesthesia: epidural.  Postpartum course has been complicated by uncomplicated.  Baby is feeding by Breast.  Bleeding: patient has not  resumed menses.  Bowel function is normal. Bladder function is normal.  Patient is not sexually active. Contraception method desired is Nexplanon.  Postpartum depression screening: negative.   The following portions of the patient's history were reviewed and updated as appropriate: allergies, current medications, past family history, past medical history, past social history, past surgical history and problem list.  Review of Systems Pertinent items are noted in HPI.  Objective:    BP (!) 100/58 (BP Location: Right Arm, Patient Position: Sitting, Cuff Size: Normal)   Pulse (!) 108   Ht 4\' 11"  (1.499 m)   Wt 162 lb (73.5 kg)   LMP 07/04/2018   BMI 32.72 kg/m   General:  alert and no distress   Breasts:  inspection negative, no nipple discharge or bleeding, no masses or nodularity palpable  Lungs: clear to auscultation bilaterally  Heart:  regular rate and rhythm, S1, S2 normal, no murmur, click, rub or gallop  Abdomen: soft, non-tender; bowel sounds normal; no masses,  no organomegaly.   Well healed Pfannenstiel incision   Vulva:  normal  Vagina: normal vagina, no discharge, exudate, lesion, or erythema  Cervix:  no cervical motion tenderness and no lesions  Corpus: normal size, contour, position, consistency, mobility, non-tender  Adnexa:  normal adnexa and no mass, fullness, tenderness  Rectal Exam: Not performed.          Assessment:  Post Partum Care visit 1. Postpartum care and examination   2. S/P cesarean section    Plan:    See orders and Patient Instructions Follow up in: 5 weeks or as needed.   No issues Pumping breast Incision healing well nexplanon at next visit Needs pap postpartum Preeclampsia- normal BP today.   Adelene Idler MD [facility], [facility] 07/14/2018 12:23 PM

## 2018-07-14 NOTE — Telephone Encounter (Signed)
2/25 at 950 with cs for nexplanon

## 2018-07-14 NOTE — Progress Notes (Signed)
Incision check. No complaints

## 2018-07-22 NOTE — Telephone Encounter (Signed)
Noted. Will order to arrive by apt date/time. 

## 2018-07-29 LAB — BLOOD GAS, ARTERIAL
Acid-base deficit: 6.6 mmol/L — ABNORMAL HIGH (ref 0.0–2.0)
Bicarbonate: 17.2 mmol/L — ABNORMAL LOW (ref 20.0–28.0)
FIO2: 0.21
O2 SAT: 97.8 %
Patient temperature: 37
pCO2 arterial: 29 mmHg — ABNORMAL LOW (ref 32.0–48.0)
pH, Arterial: 7.38 (ref 7.350–7.450)
pO2, Arterial: 102 mmHg (ref 83.0–108.0)

## 2018-08-19 ENCOUNTER — Encounter: Payer: Self-pay | Admitting: Obstetrics and Gynecology

## 2018-08-19 ENCOUNTER — Ambulatory Visit (INDEPENDENT_AMBULATORY_CARE_PROVIDER_SITE_OTHER): Payer: Medicaid Other | Admitting: Obstetrics and Gynecology

## 2018-08-19 ENCOUNTER — Other Ambulatory Visit (HOSPITAL_COMMUNITY)
Admission: RE | Admit: 2018-08-19 | Discharge: 2018-08-19 | Disposition: A | Discharge status: Home / Self Care | Payer: Medicaid Other | Source: Ambulatory Visit | Attending: Obstetrics and Gynecology | Admitting: Obstetrics and Gynecology

## 2018-08-19 VITALS — BP 118/64 | HR 76 | Ht 59.0 in | Wt 163.0 lb

## 2018-08-19 DIAGNOSIS — Z124 Encounter for screening for malignant neoplasm of cervix: Secondary | ICD-10-CM | POA: Diagnosis present

## 2018-08-19 DIAGNOSIS — Z30017 Encounter for initial prescription of implantable subdermal contraceptive: Secondary | ICD-10-CM | POA: Diagnosis not present

## 2018-08-19 MED ORDER — ETONOGESTREL 68 MG ~~LOC~~ IMPL: 68.0000 mg | DRUG_IMPLANT | Freq: Once | SUBCUTANEOUS | Status: AC

## 2018-08-19 NOTE — Progress Notes (Signed)
.   OBSTETRICS POSTPARTUM CLINIC PROGRESS NOTE Delayed chart closure  Subjective:     Shelby Alvarez is a 23 y.o. G58P0101 female who presents for a postpartum visit. She is 6 weeks postpartum following a preterm pregnancy and delivery by C-section.  I have fully reviewed the prenatal and intrapartum course. Anesthesia: spinal.  Postpartum course has been complicated by uncomplicated. .  Bleeding: patient has not  resumed menses.  Bowel function is normal. Bladder function is normal.  Patient is not sexually active. Contraception method desired is Nexplanon.  Postpartum depression screening: negative. Edinburgh 0.  The following portions of the patient's history were reviewed and updated as appropriate: allergies, current medications, past family history, past medical history, past social history, past surgical history and problem list.  Review of Systems Pertinent items are noted in HPI.  Objective:    BP 118/64   Pulse 76   Ht 4\' 11"  (1.499 m)   Wt 163 lb (73.9 kg)   LMP 08/13/2018 (Exact Date)   Breastfeeding Yes   BMI 32.92 kg/m   General:  alert and no distress   Breasts:  inspection negative, no nipple discharge or bleeding, no masses or nodularity palpable  Lungs: clear to auscultation bilaterally  Heart:  regular rate and rhythm, S1, S2 normal, no murmur, click, rub or gallop  Abdomen: soft, non-tender; bowel sounds normal; no masses,  no organomegaly.  Well healed Pfannenstiel incision   Vulva:  normal  Vagina: normal vagina, no discharge, exudate, lesion, or erythema  Cervix:  no cervical motion tenderness and no lesions  Corpus: normal size, contour, position, consistency, mobility, non-tender  Adnexa:  normal adnexa and no mass, fullness, tenderness  Rectal Exam: Not performed.         Nexplanon Insertion  Patient given informed consent, signed copy in the chart, time out was performed. Pregnancy test was negati ve. Appropriate time out taken.  Patient's  left arm was prepped and draped in the usual sterile fashion.. The ruler used to measure and mark insertion area.  Pt was prepped with betadine swab and then injected with 5 cc of 2% lidocaine with epinephrine. Nexplanon removed form packaging,  Device confirmed in needle, then inserted full length of needle and withdrawn per handbook instructions.  Pt insertion site covered with steri-strip and a bandage.   Minimal blood loss.  Pt tolerated the procedure well.    Assessment:  Post Partum Care visit There are no diagnoses linked to this encounter.  Plan:  See orders and Patient Instructions Follow up in: 12 months or as needed.   Adelene Idler MD [facility], [facility] 08/19/2018 10:52 AM

## 2018-08-19 NOTE — Patient Instructions (Signed)
Etonogestrel implant What is this medicine? ETONOGESTREL (et oh noe JES trel) is a contraceptive (birth control) device. It is used to prevent pregnancy. It can be used for up to 3 years. This medicine may be used for other purposes; ask your health care provider or pharmacist if you have questions. COMMON BRAND NAME(S): Implanon, Nexplanon What should I tell my health care provider before I take this medicine? They need to know if you have any of these conditions: -abnormal vaginal bleeding -blood vessel disease or blood clots -breast, cervical, endometrial, ovarian, liver, or uterine cancer -diabetes -gallbladder disease -heart disease or recent heart attack -high blood pressure -high cholesterol or triglycerides -kidney disease -liver disease -migraine headaches -seizures -stroke -tobacco smoker -an unusual or allergic reaction to etonogestrel, anesthetics or antiseptics, other medicines, foods, dyes, or preservatives -pregnant or trying to get pregnant -breast-feeding How should I use this medicine? This device is inserted just under the skin on the inner side of your upper arm by a health care professional. Talk to your pediatrician regarding the use of this medicine in children. Special care may be needed. Overdosage: If you think you have taken too much of this medicine contact a poison control center or emergency room at once. NOTE: This medicine is only for you. Do not share this medicine with others. What if I miss a dose? This does not apply. What may interact with this medicine? Do not take this medicine with any of the following medications: -amprenavir -fosamprenavir This medicine may also interact with the following medications: -acitretin -aprepitant -armodafinil -bexarotene -bosentan -carbamazepine -certain medicines for fungal infections like fluconazole, ketoconazole, itraconazole and voriconazole -certain medicines to treat hepatitis, HIV or  AIDS -cyclosporine -felbamate -griseofulvin -lamotrigine -modafinil -oxcarbazepine -phenobarbital -phenytoin -primidone -rifabutin -rifampin -rifapentine -St. John's wort -topiramate This list may not describe all possible interactions. Give your health care provider a list of all the medicines, herbs, non-prescription drugs, or dietary supplements you use. Also tell them if you smoke, drink alcohol, or use illegal drugs. Some items may interact with your medicine. What should I watch for while using this medicine? This product does not protect you against HIV infection (AIDS) or other sexually transmitted diseases. You should be able to feel the implant by pressing your fingertips over the skin where it was inserted. Contact your doctor if you cannot feel the implant, and use a non-hormonal birth control method (such as condoms) until your doctor confirms that the implant is in place. Contact your doctor if you think that the implant may have broken or become bent while in your arm. You will receive a user card from your health care provider after the implant is inserted. The card is a record of the location of the implant in your upper arm and when it should be removed. Keep this card with your health records. What side effects may I notice from receiving this medicine? Side effects that you should report to your doctor or health care professional as soon as possible: -allergic reactions like skin rash, itching or hives, swelling of the face, lips, or tongue -breast lumps, breast tissue changes, or discharge -breathing problems -changes in emotions or moods -if you feel that the implant may have broken or bent while in your arm -high blood pressure -pain, irritation, swelling, or bruising at the insertion site -scar at site of insertion -signs of infection at the insertion site such as fever, and skin redness, pain or discharge -signs and symptoms of a blood clot such   as breathing  problems; changes in vision; chest pain; severe, sudden headache; pain, swelling, warmth in the leg; trouble speaking; sudden numbness or weakness of the face, arm or leg -signs and symptoms of liver injury like dark yellow or brown urine; general ill feeling or flu-like symptoms; light-colored stools; loss of appetite; nausea; right upper belly pain; unusually weak or tired; yellowing of the eyes or skin -unusual vaginal bleeding, discharge Side effects that usually do not require medical attention (report to your doctor or health care professional if they continue or are bothersome): -acne -breast pain or tenderness -headache -irregular menstrual bleeding -nausea This list may not describe all possible side effects. Call your doctor for medical advice about side effects. You may report side effects to FDA at 1-800-FDA-1088. Where should I keep my medicine? This drug is given in a hospital or clinic and will not be stored at home. NOTE: This sheet is a summary. It may not cover all possible information. If you have questions about this medicine, talk to your doctor, pharmacist, or health care provider.  2019 Elsevier/Gold Standard (2017-04-30 14:11:42) Nexplanon Instructions After Insertion   Keep bandage clean and dry for 24 hours   May use ice/Tylenol/Ibuprofen for soreness or pain   If you develop fever, drainage or increased warmth from incision site-contact office immediately   

## 2018-08-22 LAB — CYTOLOGY - PAP: Diagnosis: NEGATIVE

## 2018-12-16 ENCOUNTER — Other Ambulatory Visit: Payer: Self-pay

## 2018-12-16 ENCOUNTER — Encounter: Payer: Self-pay | Admitting: Obstetrics and Gynecology

## 2018-12-16 ENCOUNTER — Ambulatory Visit (INDEPENDENT_AMBULATORY_CARE_PROVIDER_SITE_OTHER): Payer: Medicaid Other | Admitting: Obstetrics and Gynecology

## 2018-12-16 VITALS — BP 110/68 | HR 99 | Ht 59.0 in | Wt 173.0 lb

## 2018-12-16 DIAGNOSIS — B3731 Acute candidiasis of vulva and vagina: Secondary | ICD-10-CM

## 2018-12-16 DIAGNOSIS — B373 Candidiasis of vulva and vagina: Secondary | ICD-10-CM

## 2018-12-16 MED ORDER — FLUCONAZOLE 150 MG PO TABS: 150.0000 mg | ORAL_TABLET | ORAL | 2 refills | Status: AC

## 2018-12-16 NOTE — Progress Notes (Signed)
Patient ID: Shelby Alvarez, female   DOB: [birth year], 23 y.o.   MRN: [id number]  Reason for Consult: Menstrual Problem (Bleed from 10/11/2018 to 12/10/2018)   Referred by No ref. provider found  Subjective:     HPI:  Shelby PeanJennifer A Hallas is a 23 y.o. female. She presents today because she had daily persisent bleeding from 10/11/2018 to 12/10/2018. It was a small amount, but bothersome. Less than a period. She has not been breast feeding. Her infant has been doing well. She has had two yeast infections that she self treated with OTC medications.   Past Medical History:  Diagnosis Date  . No known health problems   . Pregnancy of unknown anatomic location 12/02/2017   Family History  Problem Relation Age of Onset  . Cancer Neg Hx   . Diabetes Neg Hx   . Hypertension Neg Hx   . Stroke Neg Hx   . Thyroid disease Neg Hx    Past Surgical History:  Procedure Laterality Date  . ANKLE SURGERY    . BUNIONECTOMY    . CESAREAN SECTION N/A 07/04/2018   Procedure: CESAREAN SECTION;  Surgeon: Natale MilchSchuman, Christanna R, MD;  Location: [facility] ORS;  Service: Obstetrics;  Laterality: N/A;  Time of Birth: 16:[birth year] Sex: Female Weight: 3 lb 12 oz    Short Social History:  Social History   Tobacco Use  . Smoking status: Never Smoker  . Smokeless tobacco: Never Used  Substance Use Topics  . Alcohol use: No    Alcohol/week: 0.0 standard drinks    No Known Allergies  No current outpatient medications on file.   Current Facility-Administered Medications  Medication Dose Route Frequency Provider Last Rate Last Dose  . etonogestrel (NEXPLANON) implant 68 mg  68 mg Subdermal Once Schuman, Christanna R, MD        REVIEW OF SYSTEMS      Objective:  Objective   Vitals:   12/16/18 1512  BP: 110/68  Pulse: 99  Weight: 173 lb (78.5 kg)  Height: 4\' 11"  (1.499 m)   Body mass index is 34.94 kg/m.  Physical Exam Vitals signs and nursing note reviewed.  Constitutional:      Appearance: She is  well-developed.  HENT:     Head: Normocephalic and atraumatic.  Eyes:     Pupils: Pupils are equal, round, and reactive to light.  Cardiovascular:     Rate and Rhythm: Normal rate and regular rhythm.  Pulmonary:     Effort: Pulmonary effort is normal. No respiratory distress.  Abdominal:     General: Abdomen is flat.  Skin:    General: Skin is warm and dry.  Neurological:     Mental Status: She is alert and oriented to person, place, and time.  Psychiatric:        Behavior: Behavior normal.        Thought Content: Thought content normal.        Judgment: Judgment normal.         Assessment/Plan:     23 yo with irregular bleeding    Irregular bleeding with Nexplanon- small persistent spotting, has resolved. Would like to continue to monitor. Given sample OCP pack to use if bleeding returns.  Would like to have yeast medication on hand if she has an infection. She took OTC miconazole twice recently.   More than 15 minutes were spent face to face with the patient in the room with more than 50% of the time spent providing counseling and  discussing the plan of management. We discussed.   Adrian Prows MD [facility], [facility] 12/16/2018 3:56 PM

## 2019-07-25 IMAGING — DX DG CHEST 1V
1 series · 1 of 1 positions shown · non-contrast
Comparison: Prior radiograph from 07/02/2005.

CLINICAL DATA: Initial evaluation for acute shortness of breath.

EXAM:
CHEST  1 VIEW

[chest ap]
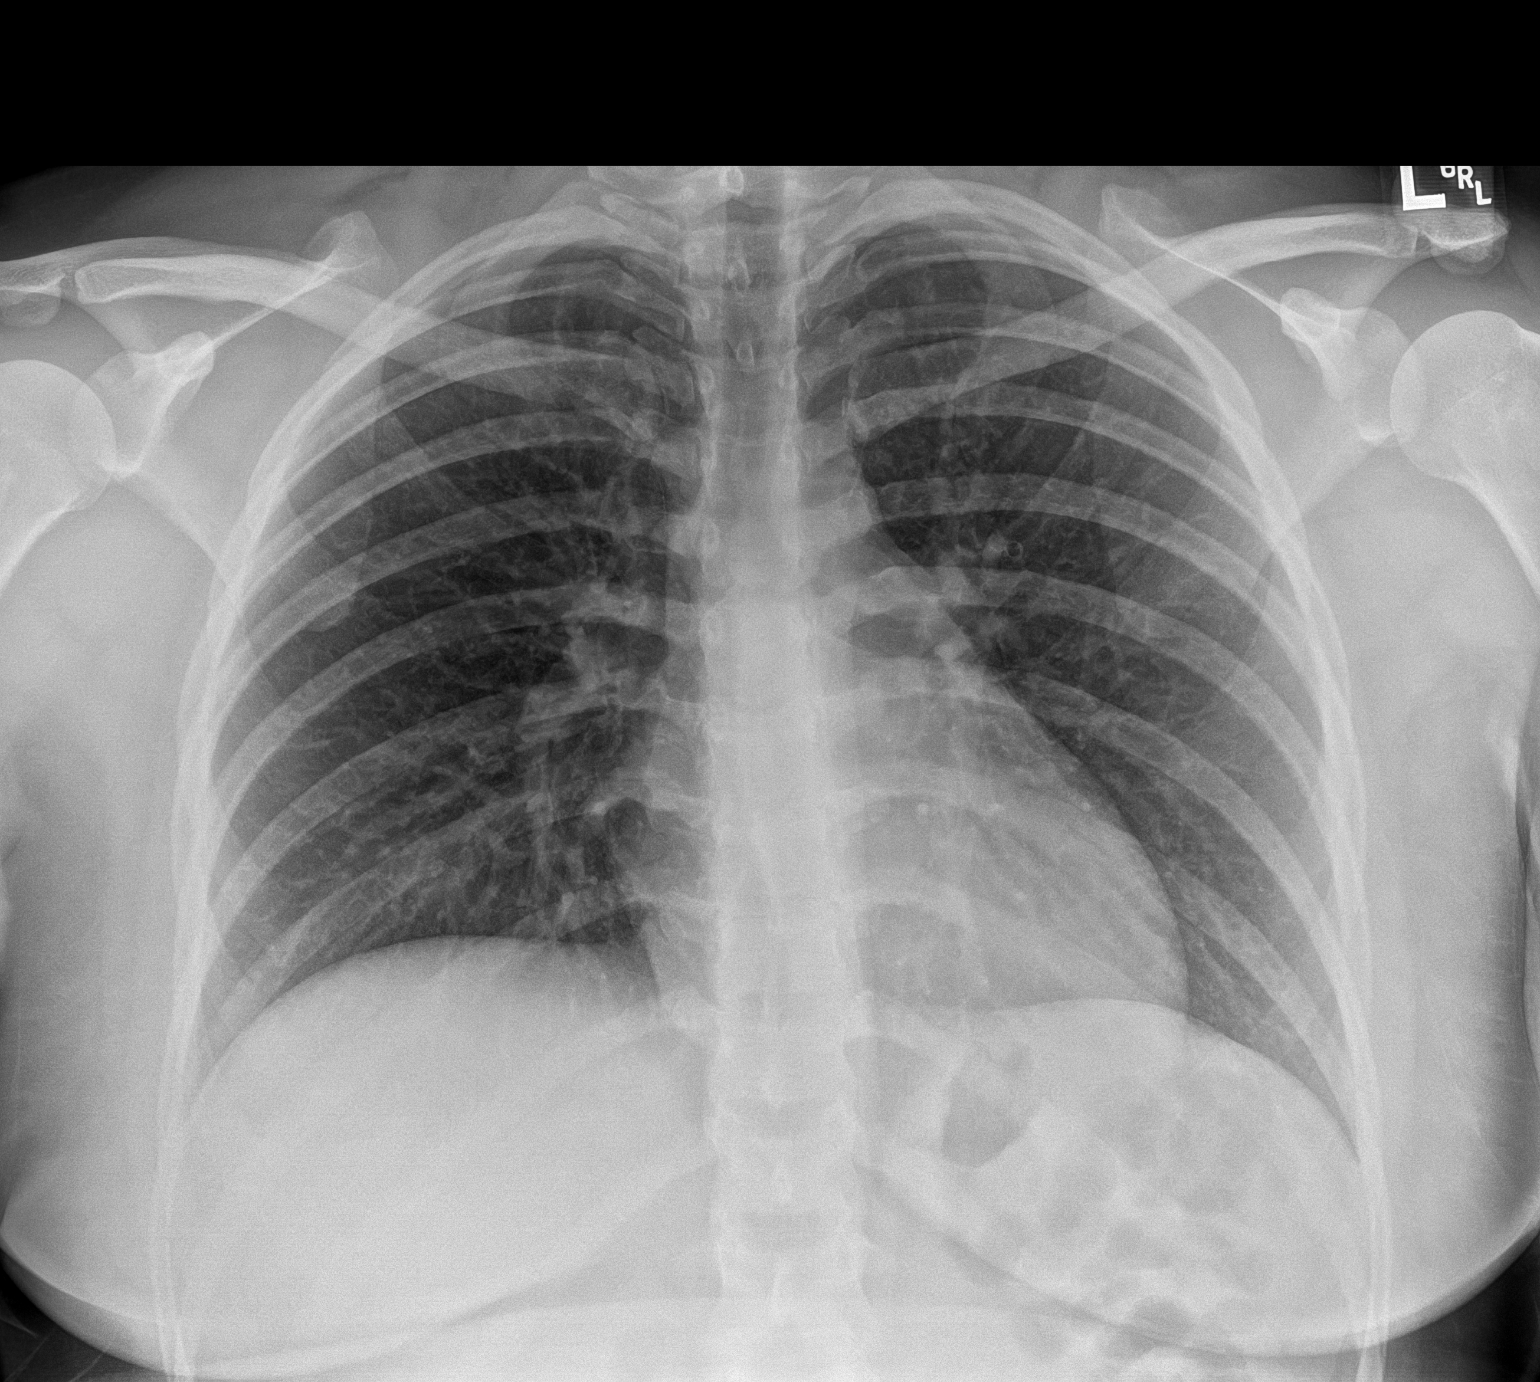

[1 of 1 positions shown; findings below may reference images not displayed]

FINDINGS: The cardiac and mediastinal silhouettes are stable in size and
contour, and remain within normal limits.

The lungs are normally inflated. No airspace consolidation, pleural
effusion, or pulmonary edema is identified. There is no
pneumothorax.

No acute osseous abnormality identified.  Trace scoliosis.
IMPRESSION: No active cardiopulmonary disease.

## 2019-09-04 IMAGING — US US OB COMP LESS 14 WK
1 series · 14 of 28 positions shown · non-contrast
Comparison: None.

CLINICAL DATA: Vaginal bleeding.

EXAM:
OBSTETRIC <14 WK ULTRASOUND
TECHNIQUE: Transabdominal ultrasound was performed for evaluation of the
gestation as well as the maternal uterus and adnexal regions.

[Series 1: us ob comp less 14 wk · 44 acquisitions, 14 frames shown]
[im 2/44]
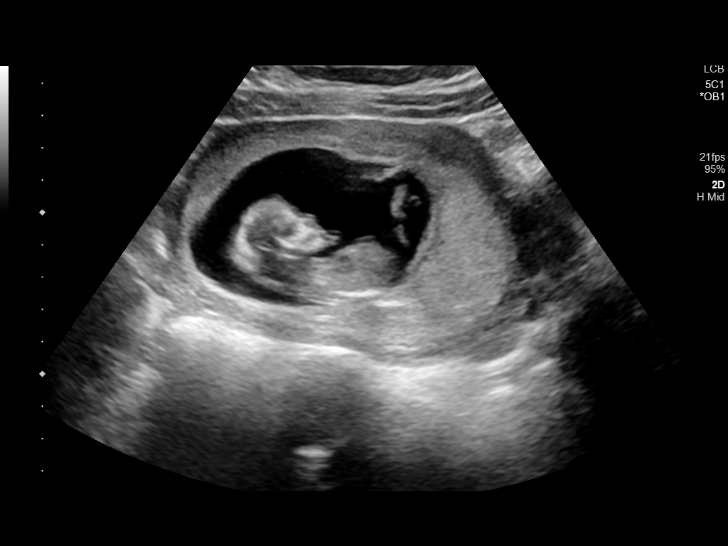
[im 5/44]
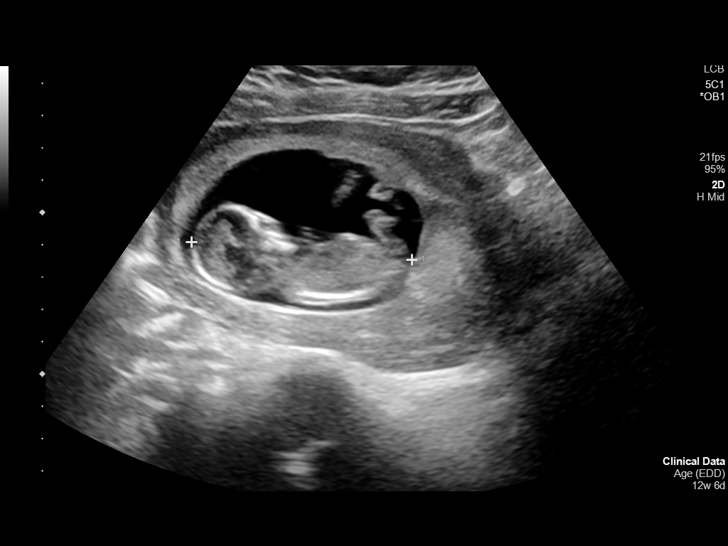
[im 8/44]
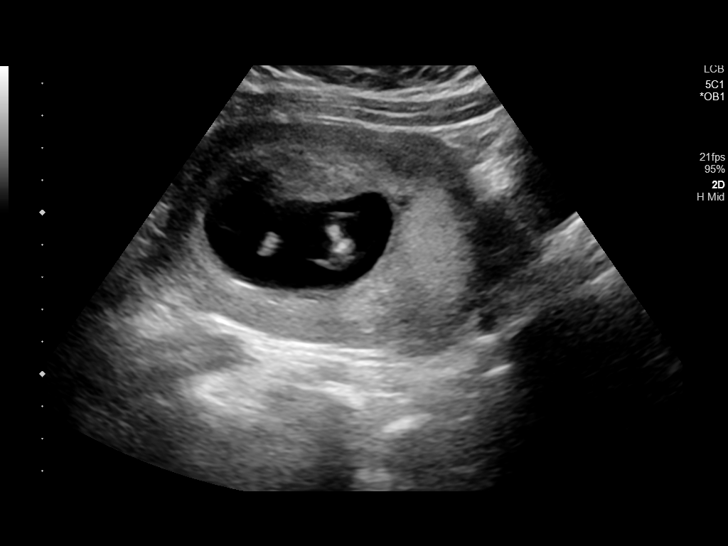
[im 12/44]
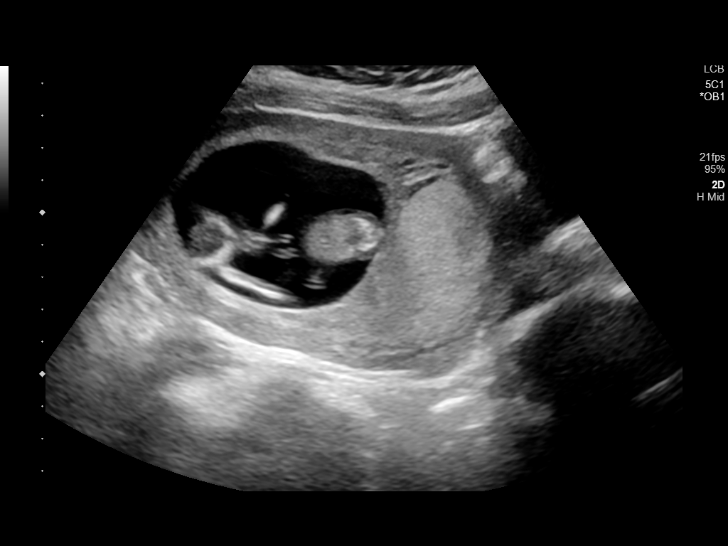
[im 15/44]
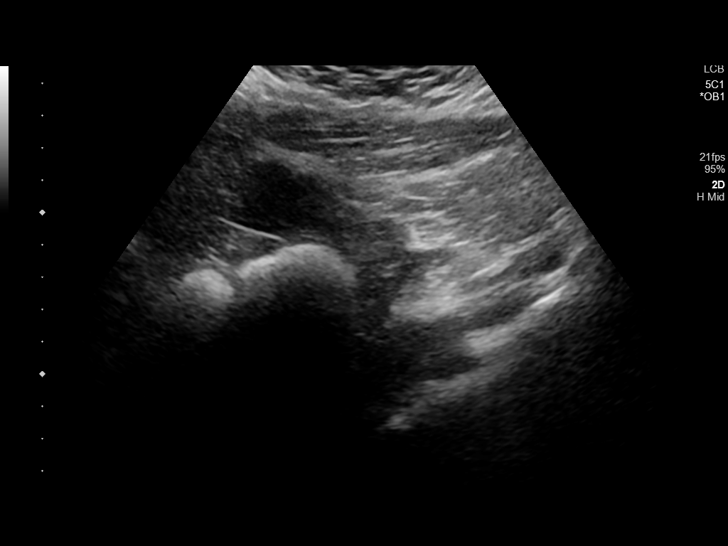
[im 18/44]
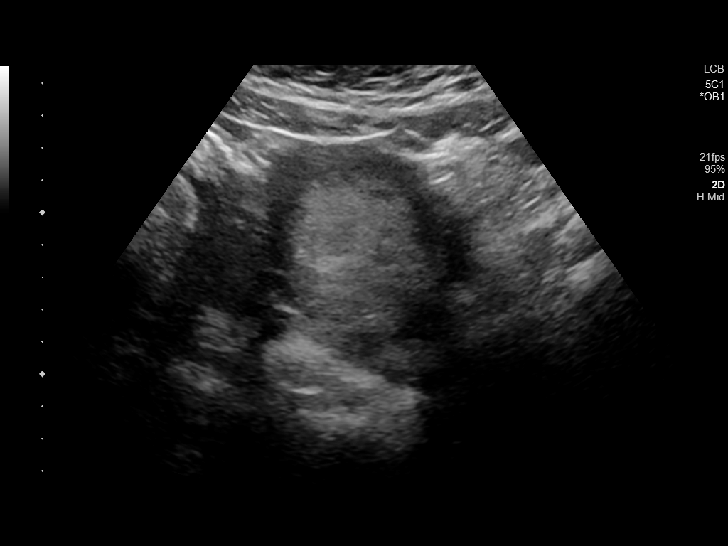
[im 21/44]
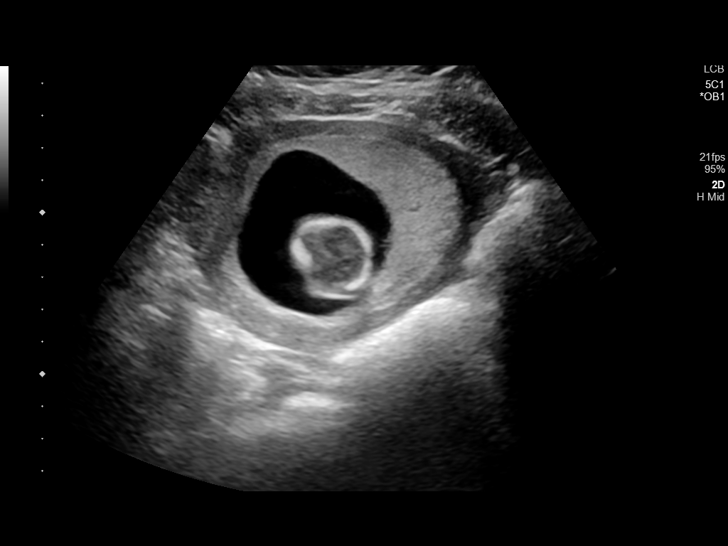
[im 24/44]
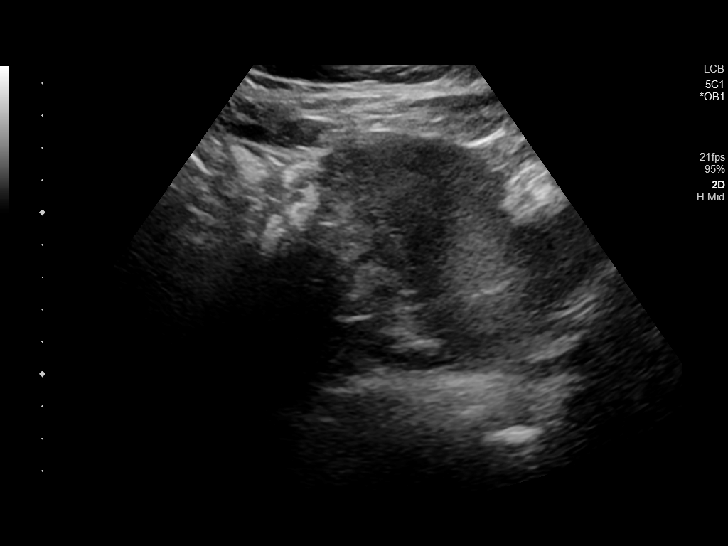
[im 28/44]
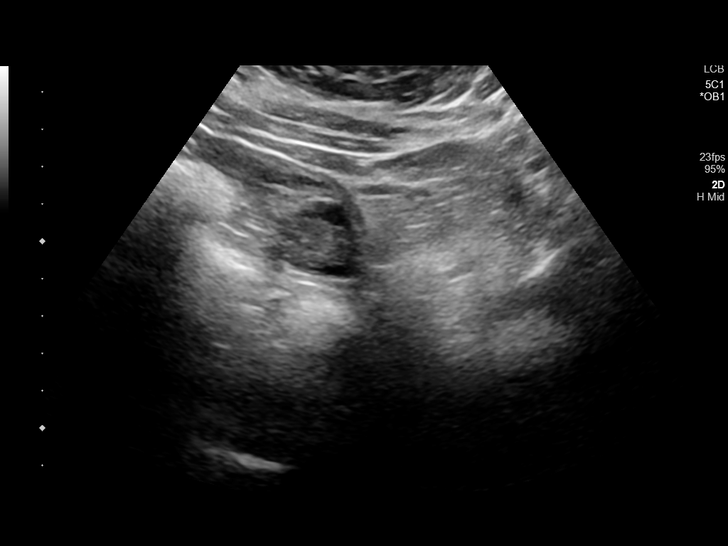
[im 31/44]
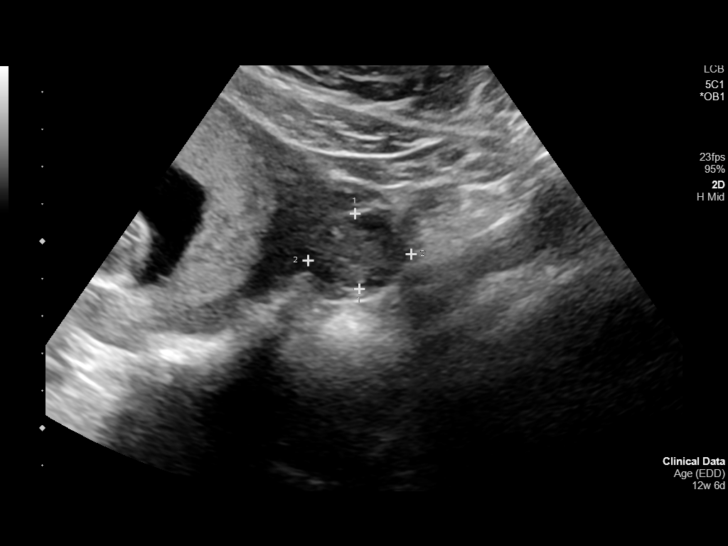
[im 34/44]
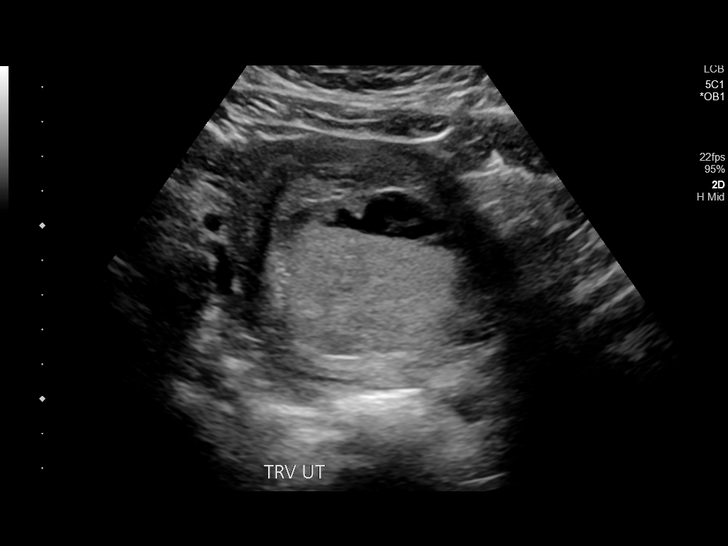
[im 37/44]
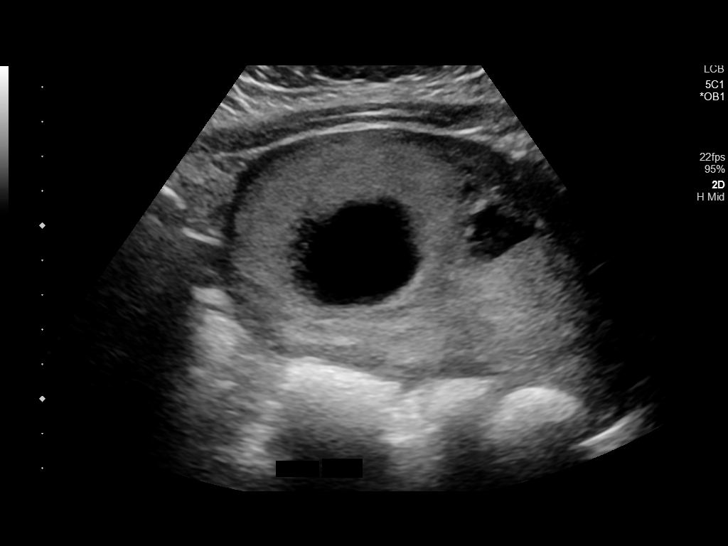
[im 40/44]
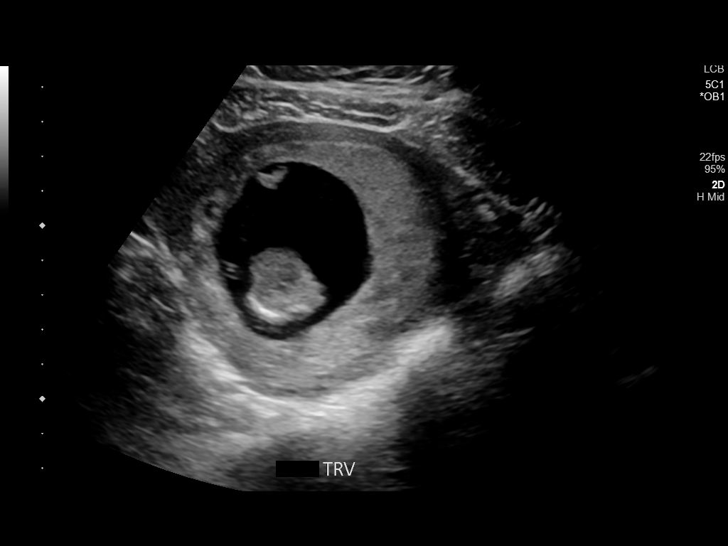
[im 44/44]
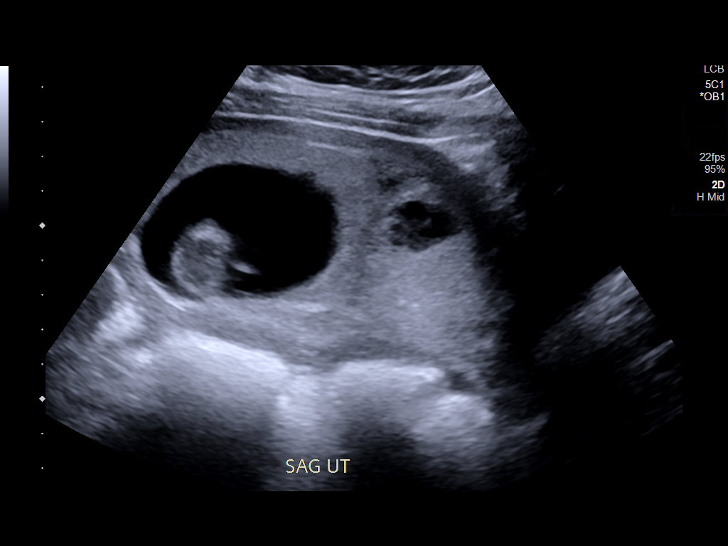

[14 of 28 positions shown; findings below may reference images not displayed]

FINDINGS: Intrauterine gestational sac: Single

Yolk sac:  Not Visualized.

Embryo:  Visualized.

Cardiac Activity: Visualized.

Heart Rate: 182 bpm

MSD:   mm    w     d

CRL:   68 mm   13 w 1 d                  US EDC: 08/01/2018

Subchorionic hemorrhage: Echogenic masslike structure located
inferior to the gestational sac, measuring 4 x 1.3 x 2.9 cm,
presumed subchorionic hemorrhage.

Maternal uterus/adnexae: RIGHT ovary is not seen but there is no
mass or free fluid in the RIGHT adnexal region. Maternal LEFT ovary
appears normal. No mass or free fluid in the LEFT adnexal region.
IMPRESSION: 1. Presumed acute subchorionic hemorrhage located inferior to the
gestational sac, moderate to large in size, measuring 4 x 1.3 x
cm.
2. Single live intrauterine pregnancy with estimated gestational age
of 13 weeks and 1 day. Fetal heart rate measured at 182 beats per
minute.
3. No adnexal mass or free fluid.

## 2020-03-11 ENCOUNTER — Ambulatory Visit: Payer: Medicaid Other | Admitting: Podiatry

## 2020-06-01 ENCOUNTER — Emergency Department (HOSPITAL_COMMUNITY)
Admission: EM | Admit: 2020-06-01 | Discharge: 2020-06-01 | Discharge status: Against Medical Advice | Payer: Medicaid Other

## 2020-06-01 ENCOUNTER — Other Ambulatory Visit: Payer: Self-pay

## 2021-05-29 NOTE — Telephone Encounter (Signed)
Nexplanon rcvd/charged 08/19/2018

## 2021-08-16 ENCOUNTER — Ambulatory Visit: Payer: Medicaid Other | Admitting: Obstetrics and Gynecology

## 2021-08-28 ENCOUNTER — Encounter: Payer: Self-pay | Admitting: Obstetrics and Gynecology

## 2021-08-28 ENCOUNTER — Other Ambulatory Visit: Payer: Self-pay

## 2021-08-28 ENCOUNTER — Ambulatory Visit (INDEPENDENT_AMBULATORY_CARE_PROVIDER_SITE_OTHER): Payer: Medicaid Other | Admitting: Obstetrics and Gynecology

## 2021-08-28 VITALS — BP 112/60 | Ht 59.0 in | Wt 170.0 lb

## 2021-08-28 DIAGNOSIS — Z3046 Encounter for surveillance of implantable subdermal contraceptive: Secondary | ICD-10-CM

## 2021-08-28 NOTE — Progress Notes (Signed)
? ?  Chief Complaint  ?Patient presents with  ? Nexplanon Removal   ? ? ? ?History of Present Illness:  Shelby Alvarez is a 26 y.o. that had a nexplanon placed approximately 3 years  ago. Since that time, she  has done well. Menses almost monthly now, lasting a few days to 2 wks, mild dysmen. Would like to conceive.  ?Pap/STD testing due but pt declines today since we're OON with her insurance.  ? ?BP 112/60   Ht 4\' 11"  (1.499 m)   Wt 170 lb (77.1 kg)   BMI 34.34 kg/m?  ? ? ?Nexplanon removal ?Procedure note - The Nexplanon was noted in the patient's arm and the end was identified. The skin was cleansed with a Betadine solution. A small injection of subcutaneous lidocaine with epinephrine was given over the end of the implant. An incision was made at the end of the implant. The rod was noted in the incision and grasped with a hemostat. It was noted to be intact.  Steri-Strip was placed approximating the incision. Hemostasis was noted. ? ?Assessment: ?Nexplanon removal ? ? ?Plan:   ?She was told to remove the dressing in 12-24 hours, to keep the incision area dry for 24 hours and to remove the Steristrip in 2-3  days.  ?Notify 06-09-1992 if any signs of tenderness, redness, pain, or fevers develop. ? ?Pt aware to schedule annual with [place] network provider.  ? ?Latosha Gaylord B. Kolleen [facility], PA-C ?08/28/2021 ?1:57 PM  ?

## 2021-08-28 NOTE — Patient Instructions (Signed)
I value your feedback and you entrusting us with your care. If you get a [facility] patient survey, I would appreciate you taking the time to let us know about your experience today. Thank you!  Remove the dressing in 24 hours,  keep the incision area dry for 24 hours and remove the Steristrip in 2-3  days.  Notify us if any signs of tenderness, redness, pain, or fevers develop.
